# Patient Record
Sex: Male | Born: 1950
Health system: Southern US, Community
[De-identification: ages and names within clinical notes are randomized; demographics above are authoritative.]

## PROBLEM LIST (undated history)

## (undated) DIAGNOSIS — G4730 Sleep apnea, unspecified: Secondary | ICD-10-CM

## (undated) DIAGNOSIS — E785 Hyperlipidemia, unspecified: Secondary | ICD-10-CM

## (undated) DIAGNOSIS — E7800 Pure hypercholesterolemia, unspecified: Secondary | ICD-10-CM

## (undated) DIAGNOSIS — I10 Essential (primary) hypertension: Secondary | ICD-10-CM

## (undated) DIAGNOSIS — K219 Gastro-esophageal reflux disease without esophagitis: Secondary | ICD-10-CM

## (undated) DIAGNOSIS — I219 Acute myocardial infarction, unspecified: Secondary | ICD-10-CM

## (undated) DIAGNOSIS — I2510 Atherosclerotic heart disease of native coronary artery without angina pectoris: Secondary | ICD-10-CM

## (undated) DIAGNOSIS — E119 Type 2 diabetes mellitus without complications: Secondary | ICD-10-CM

## (undated) DIAGNOSIS — R42 Dizziness and giddiness: Secondary | ICD-10-CM

## (undated) DIAGNOSIS — L570 Actinic keratosis: Secondary | ICD-10-CM

## (undated) HISTORY — DX: Type 2 diabetes mellitus without complications: E11.9

## (undated) HISTORY — DX: Sleep apnea, unspecified: G47.30

## (undated) HISTORY — PX: CARDIAC CATHETERIZATION: SHX172

## (undated) HISTORY — DX: Gastro-esophageal reflux disease without esophagitis: K21.9

## (undated) HISTORY — PX: TONSILLECTOMY: SUR1361

## (undated) HISTORY — DX: Acute myocardial infarction, unspecified: I21.9

## (undated) HISTORY — DX: Essential (primary) hypertension: I10

## (undated) HISTORY — DX: Hyperlipidemia, unspecified: E78.5

## (undated) HISTORY — PX: CORONARY ANGIOPLASTY WITH STENT PLACEMENT: SHX49

## (undated) HISTORY — DX: Atherosclerotic heart disease of native coronary artery without angina pectoris: I25.10

---

## 2005-04-25 ENCOUNTER — Ambulatory Visit: Payer: Self-pay | Admitting: Family Medicine

## 2005-05-23 ENCOUNTER — Ambulatory Visit: Payer: Self-pay | Admitting: Family Medicine

## 2007-12-02 DIAGNOSIS — C4492 Squamous cell carcinoma of skin, unspecified: Secondary | ICD-10-CM

## 2007-12-02 HISTORY — DX: Squamous cell carcinoma of skin, unspecified: C44.92

## 2008-07-20 ENCOUNTER — Ambulatory Visit: Payer: Self-pay | Admitting: Gastroenterology

## 2010-11-30 ENCOUNTER — Emergency Department: Payer: Self-pay | Admitting: Emergency Medicine

## 2010-12-27 ENCOUNTER — Ambulatory Visit: Payer: Self-pay | Admitting: Urology

## 2010-12-27 IMAGING — CR DG ABDOMEN 1V
1 series · 2 of 2 positions shown · non-contrast
Comparison: none

REASON FOR EXAM: Nephrolithiasis
COMMENTS:

[Series 1: view not recorded · 0.17mm/px · 2 of 2 slices shown]
[im 1/2]
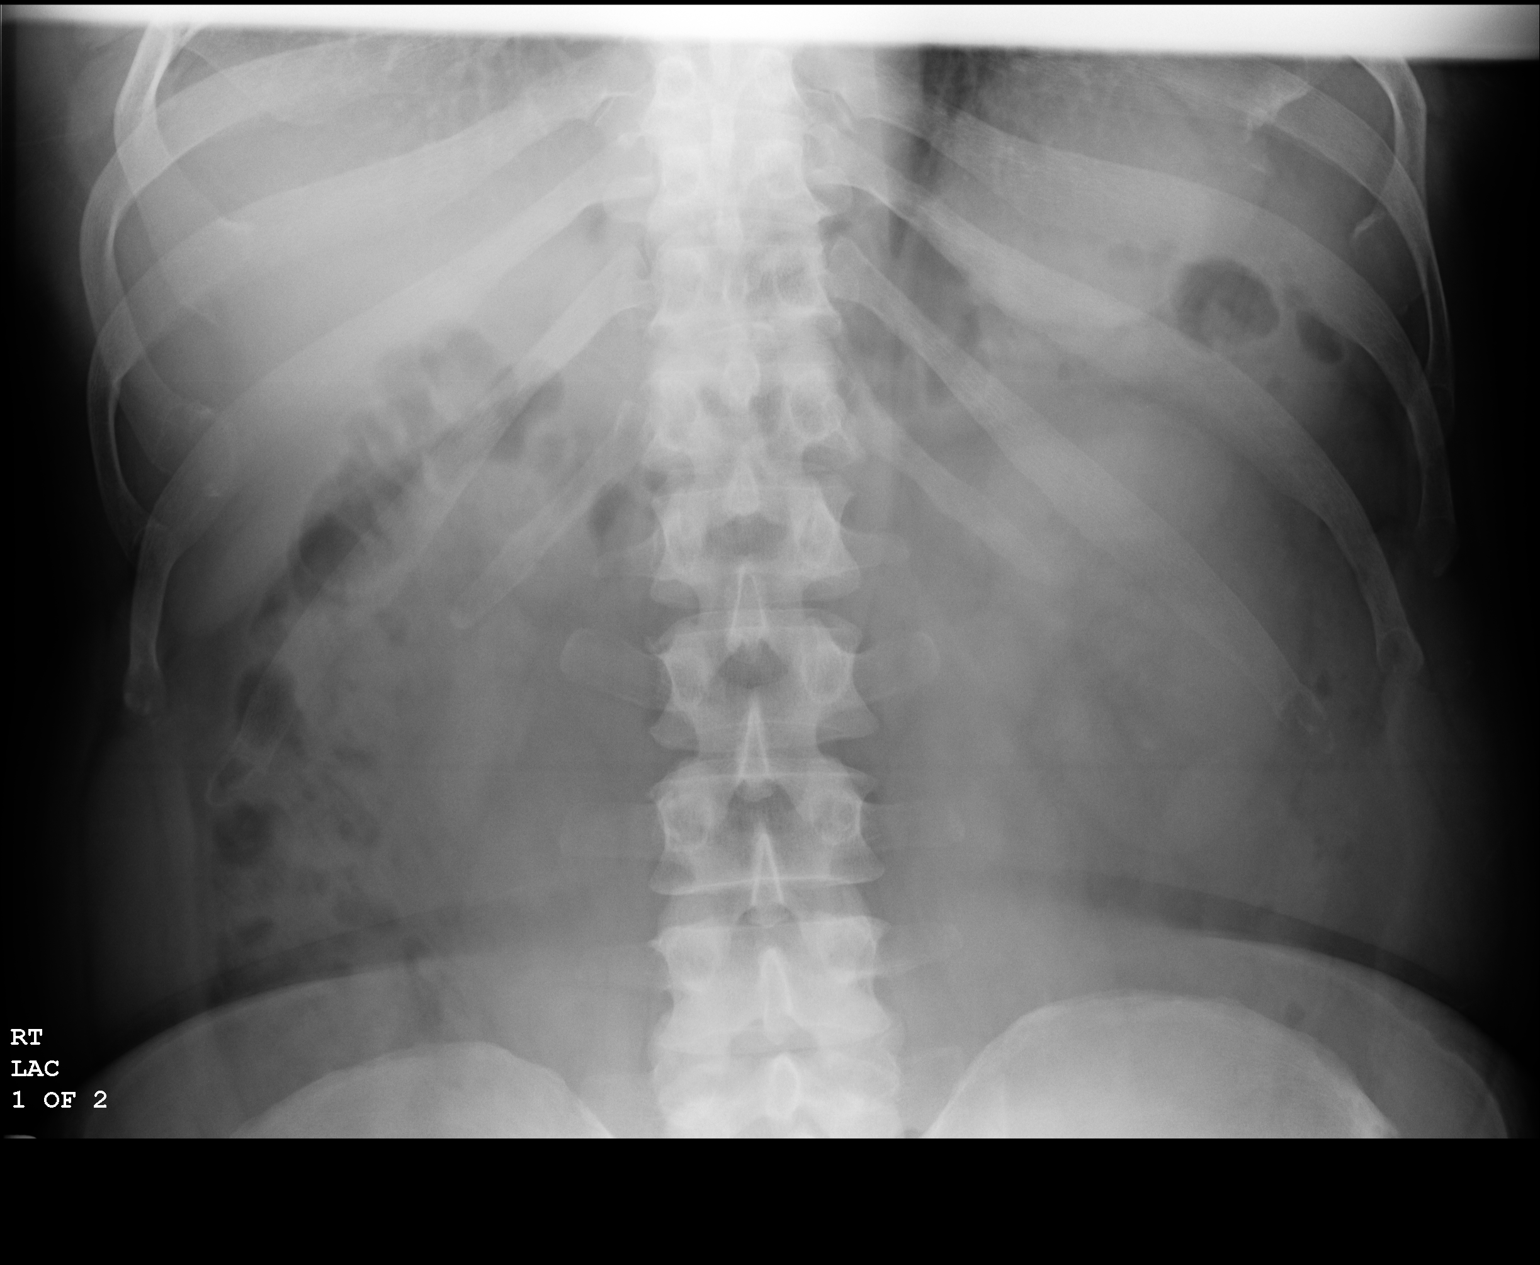
[im 2/2]
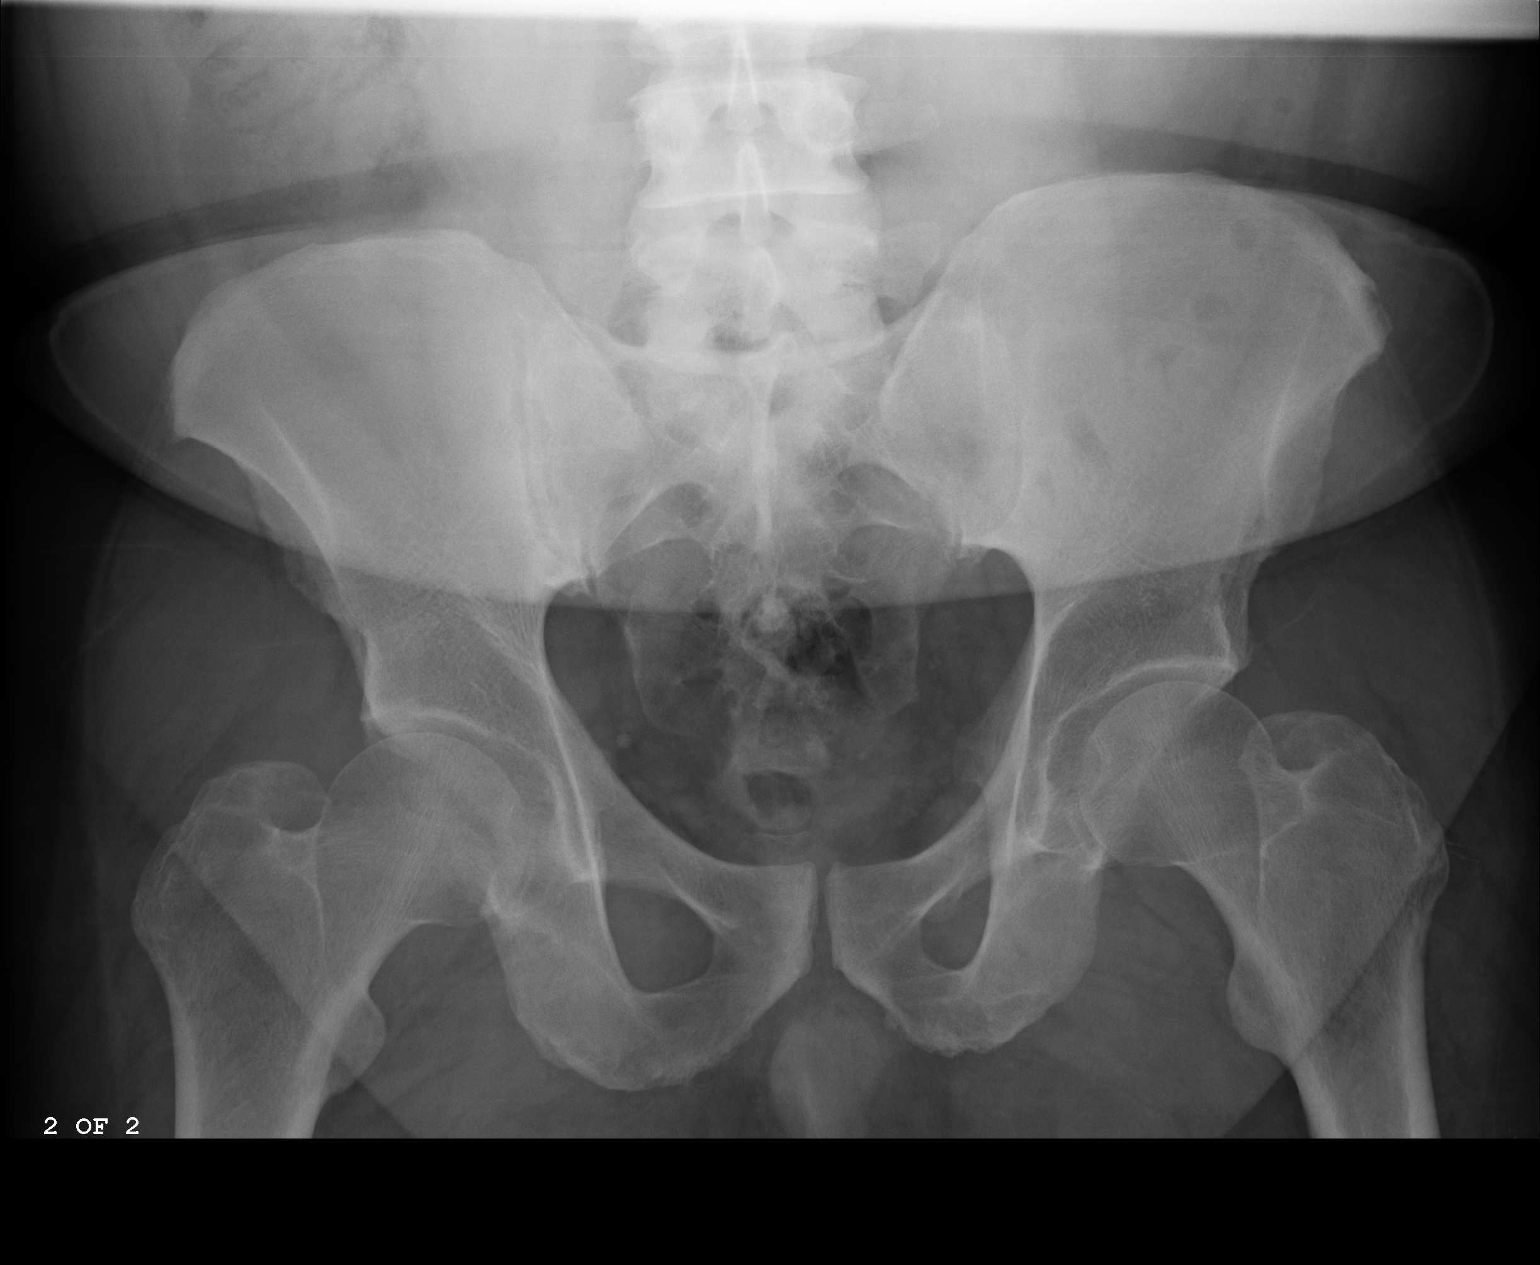

[2 of 2 positions shown; findings below may reference images not displayed]

PROCEDURE:     DXR - DXR KIDNEY URETER BLADDER  - [DATE]  [DATE]

RESULT:     An AP view of the abdomen demonstrates no definite renal or
ureteral calcifications are identified. There are two tiny calcifications on
the left projected lateral to the sacrum. These conceivably could represent
the tiny distal left ureteral stones noted at prior CT but these also may
represent tiny phleboliths. There is also noted a 2 mm calcification
projected over the region of the left ureterovesical junction. This, too, is
nonspecific and could represent a tiny stone or phlebolith. A 5 mm
phlebolith is noted in the right pelvis.
IMPRESSION: 1. No definite renal or ureteral calcifications are seen.
2. There are a few tiny calcifications in the left pelvis but it is
uncertain as to whether these represent the obstructing calcifications
previously noted at CT or if the densities are external to the urinary tract.
3. A phlebolith is noted in the right pelvis.
4. The bowel gas pattern is normal.

## 2012-07-01 ENCOUNTER — Ambulatory Visit: Payer: Self-pay | Admitting: Otolaryngology

## 2012-07-01 LAB — CREATININE, SERUM: EGFR (Non-African Amer.): >60

## 2013-08-17 ENCOUNTER — Ambulatory Visit: Payer: Self-pay | Admitting: Physician Assistant

## 2014-04-11 ENCOUNTER — Ambulatory Visit: Payer: Self-pay | Admitting: Adult Health

## 2014-04-13 ENCOUNTER — Ambulatory Visit (INDEPENDENT_AMBULATORY_CARE_PROVIDER_SITE_OTHER): Payer: Commercial Managed Care - PPO

## 2014-04-13 ENCOUNTER — Encounter: Payer: Self-pay | Admitting: Podiatry

## 2014-04-13 ENCOUNTER — Ambulatory Visit (INDEPENDENT_AMBULATORY_CARE_PROVIDER_SITE_OTHER): Payer: Commercial Managed Care - PPO | Admitting: Podiatry

## 2014-04-13 VITALS — BP 150/94 | HR 64 | Resp 18

## 2014-04-13 DIAGNOSIS — R52 Pain, unspecified: Secondary | ICD-10-CM

## 2014-04-13 DIAGNOSIS — M779 Enthesopathy, unspecified: Secondary | ICD-10-CM

## 2014-04-13 MED ORDER — DICLOFENAC SODIUM 1 % TD GEL: 2.0000 g | Freq: Four times a day (QID) | TRANSDERMAL | Status: DC

## 2014-04-13 MED ORDER — MELOXICAM 7.5 MG PO TABS: 7.5000 mg | ORAL_TABLET | Freq: Every day | ORAL | Status: DC

## 2014-04-13 NOTE — Progress Notes (Signed)
   Subjective:    Patient ID: Andre Schroeder, male    DOB: 04/11/1950, 64 y.o.   MRN: [id number]  HPI  64 year old male presents the office today with complaints of pain to his right foot and ankle which is been ongoing for approximately 2 weeks. He states the majority of the pain is on the outside of this foot and ankle.  He states it is a dull ache and intermittent leg has some sharp pain and swelling to the area. He states that today it is not as painful and it appears to be resolving. He denies any recent injury or trauma to the area. He states that he has pain mostly when wearing flat shoes however he does not seem to have symptoms when wearing a supportive shoe.He does state that he has a history of a left leg blood clot approximate one year ago and he started to feel as if it was the same symptoms so he had a venous duplex performed yesterday which was negative for DVT.   Review of Systems  Neurological: Positive for dizziness.  All other systems reviewed and are negative.      Objective:   Physical Exam AAO x3, NAD DP/PT pulses palpable b/l, CRT < 3 sec Sensation intact with Simms Weinstein monofilament, vibratory sensation intact, Achilles tendon reflex intact. There is mild tenderness on the lateral facet of foot and ankle which appears to be overlying the course of the peroneal tendons and along the insertion of the fifth metatarsal. There is also some discomfort on the plantar aspects of the lateral foot. There is no pinpoint bony tenderness or pain with vibratory sensation overlying the fifth metatarsal or in other areas of the foot. There is mild overlying edema without any overlying erythema or increase in warmth. Upon gait the patient does tend to have more of a supinated type gait and putting excess pressure on the lateral aspect of the foot. MMT 5/5, ROM WNL No open lesions or pre-ulcerative lesions are identified. No pain with calf compression, swelling, warmth, erythema.       Assessment & Plan:  64 year old male with lateral foot and ankle pain, likely resolving peroneal tendinitis -X-rays were obtained and reviewed with the patient. -Treatment options were discussed with the patient including alternatives, risks, complications. Discussed likely etiology of the patient's symptoms. -Prescribed meloxicam. Discussed side effects the medication digit itself immediately should any occur and call the office. -Prescribed Voltaren gel. -Ice to the area. -Discussed supportive shoe gear and possible orthotics. -Discussed stretching and streaking exercises for the peroneal tendons. -Follow-up in 4 weeks or sooner should any problems arise. In the meantime encouraged to call the office with any questions, concerns, change in symptoms.

## 2014-04-17 ENCOUNTER — Encounter: Payer: Self-pay | Admitting: Podiatry

## 2014-05-01 ENCOUNTER — Telehealth: Payer: Self-pay | Admitting: *Deleted

## 2014-05-01 NOTE — Telephone Encounter (Signed)
I faxed a prescription for a compound cream to [facility] per Dr. Jacqualyn Posey to treat Tendinosis. Baclofen 2%, Diclofenac 3% and Verapamil 10%, Quantity: 180gm, Refills: 2, Diagnosis: Peroneal Tendonitis No Known Drug Allergies  I called and left him a message to expect compound cream to arrive in the mail.  We were not able to get other prescribed medication approved by your insurance.

## 2018-04-07 DIAGNOSIS — H52223 Regular astigmatism, bilateral: Secondary | ICD-10-CM | POA: Diagnosis not present

## 2018-05-27 ENCOUNTER — Ambulatory Visit: Payer: Medicare HMO | Attending: Neurology

## 2018-05-27 DIAGNOSIS — G4733 Obstructive sleep apnea (adult) (pediatric): Secondary | ICD-10-CM | POA: Insufficient documentation

## 2018-05-27 DIAGNOSIS — E669 Obesity, unspecified: Secondary | ICD-10-CM | POA: Diagnosis not present

## 2018-05-27 DIAGNOSIS — R0683 Snoring: Secondary | ICD-10-CM | POA: Diagnosis not present

## 2018-05-27 DIAGNOSIS — R400 Somnolence: Secondary | ICD-10-CM | POA: Diagnosis not present

## 2018-06-14 DIAGNOSIS — K219 Gastro-esophageal reflux disease without esophagitis: Secondary | ICD-10-CM | POA: Diagnosis not present

## 2018-06-14 DIAGNOSIS — G4733 Obstructive sleep apnea (adult) (pediatric): Secondary | ICD-10-CM | POA: Diagnosis not present

## 2018-06-14 DIAGNOSIS — E7800 Pure hypercholesterolemia, unspecified: Secondary | ICD-10-CM | POA: Diagnosis not present

## 2018-06-14 DIAGNOSIS — I2510 Atherosclerotic heart disease of native coronary artery without angina pectoris: Secondary | ICD-10-CM | POA: Diagnosis not present

## 2018-06-14 DIAGNOSIS — R7309 Other abnormal glucose: Secondary | ICD-10-CM | POA: Diagnosis not present

## 2018-06-14 DIAGNOSIS — I10 Essential (primary) hypertension: Secondary | ICD-10-CM | POA: Diagnosis not present

## 2018-06-23 DIAGNOSIS — I2510 Atherosclerotic heart disease of native coronary artery without angina pectoris: Secondary | ICD-10-CM | POA: Diagnosis not present

## 2018-06-23 DIAGNOSIS — E7800 Pure hypercholesterolemia, unspecified: Secondary | ICD-10-CM | POA: Diagnosis not present

## 2018-06-23 DIAGNOSIS — G4733 Obstructive sleep apnea (adult) (pediatric): Secondary | ICD-10-CM | POA: Diagnosis not present

## 2018-06-23 DIAGNOSIS — I10 Essential (primary) hypertension: Secondary | ICD-10-CM | POA: Diagnosis not present

## 2018-06-30 DIAGNOSIS — K219 Gastro-esophageal reflux disease without esophagitis: Secondary | ICD-10-CM | POA: Diagnosis not present

## 2018-06-30 DIAGNOSIS — Z1211 Encounter for screening for malignant neoplasm of colon: Secondary | ICD-10-CM | POA: Diagnosis not present

## 2018-06-30 DIAGNOSIS — I2510 Atherosclerotic heart disease of native coronary artery without angina pectoris: Secondary | ICD-10-CM | POA: Diagnosis not present

## 2018-07-02 DIAGNOSIS — R7309 Other abnormal glucose: Secondary | ICD-10-CM | POA: Diagnosis not present

## 2018-07-02 DIAGNOSIS — G4733 Obstructive sleep apnea (adult) (pediatric): Secondary | ICD-10-CM | POA: Diagnosis not present

## 2018-07-02 DIAGNOSIS — I10 Essential (primary) hypertension: Secondary | ICD-10-CM | POA: Diagnosis not present

## 2018-07-08 DIAGNOSIS — G4733 Obstructive sleep apnea (adult) (pediatric): Secondary | ICD-10-CM | POA: Diagnosis not present

## 2018-07-09 DIAGNOSIS — G4733 Obstructive sleep apnea (adult) (pediatric): Secondary | ICD-10-CM | POA: Diagnosis not present

## 2018-08-07 DIAGNOSIS — G4733 Obstructive sleep apnea (adult) (pediatric): Secondary | ICD-10-CM | POA: Diagnosis not present

## 2018-09-07 DIAGNOSIS — G4733 Obstructive sleep apnea (adult) (pediatric): Secondary | ICD-10-CM | POA: Diagnosis not present

## 2018-10-07 DIAGNOSIS — G4733 Obstructive sleep apnea (adult) (pediatric): Secondary | ICD-10-CM | POA: Diagnosis not present

## 2018-10-08 DIAGNOSIS — Z0000 Encounter for general adult medical examination without abnormal findings: Secondary | ICD-10-CM | POA: Diagnosis not present

## 2018-10-08 DIAGNOSIS — E119 Type 2 diabetes mellitus without complications: Secondary | ICD-10-CM | POA: Diagnosis not present

## 2018-10-08 DIAGNOSIS — E7800 Pure hypercholesterolemia, unspecified: Secondary | ICD-10-CM | POA: Diagnosis not present

## 2018-10-08 DIAGNOSIS — I10 Essential (primary) hypertension: Secondary | ICD-10-CM | POA: Diagnosis not present

## 2018-10-08 DIAGNOSIS — I2510 Atherosclerotic heart disease of native coronary artery without angina pectoris: Secondary | ICD-10-CM | POA: Diagnosis not present

## 2018-10-22 ENCOUNTER — Encounter: Payer: Medicare HMO | Attending: Family Medicine | Admitting: *Deleted

## 2018-10-22 ENCOUNTER — Encounter: Payer: Self-pay | Admitting: *Deleted

## 2018-10-22 ENCOUNTER — Other Ambulatory Visit: Payer: Self-pay

## 2018-10-22 VITALS — BP 116/76 | Ht 69.0 in | Wt 258.2 lb

## 2018-10-22 DIAGNOSIS — E119 Type 2 diabetes mellitus without complications: Secondary | ICD-10-CM | POA: Diagnosis not present

## 2018-10-22 NOTE — Progress Notes (Signed)
Diabetes Self-Management Education  Visit Type: First/Initial  Appt. Start Time: 1330 Appt. End Time: 0017  10/22/2018  [name], identified by name and date of birth, is a 68 y.o. male with a diagnosis of Diabetes: Type 2.   ASSESSMENT  Blood pressure 116/76, height 5\' 9"  (1.753 m), weight 258 lb 3.2 oz (117.1 kg). Body mass index is 38.13 kg/m.  Diabetes Self-Management Education - 10/22/18 1514      Visit Information   Visit Type  First/Initial      Initial Visit   Diabetes Type  Type 2    Are you currently following a meal plan?  Yes    What type of meal plan do you follow?  "cutting out bread and sweet tea"    Are you taking your medications as prescribed?  Yes    Date Diagnosed  1 month      Health Coping   How would you rate your overall health?  Good      Psychosocial Assessment   Patient Belief/Attitude about Diabetes  Other (comment)   "concerned"   Self-care barriers  None    Self-management support  Doctor's office;Family    Patient Concerns  Nutrition/Meal planning;Medication;Monitoring;Glycemic Control;Weight Control    Special Needs  None    Preferred Learning Style  Auditory    Learning Readiness  Change in progress    How often do you need to have someone help you when you read instructions, pamphlets, or other written materials from your doctor or pharmacy?  1 - Never    What is the last grade level you completed in school?  BA      Pre-Education Assessment   Patient understands the diabetes disease and treatment process.  Needs Instruction    Patient understands incorporating nutritional management into lifestyle.  Needs Instruction    Patient undertands incorporating physical activity into lifestyle.  Needs Instruction    Patient understands using medications safely.  Needs Instruction    Patient understands monitoring blood glucose, interpreting and using results  Needs Instruction    Patient understands prevention, detection, and treatment  of acute complications.  Needs Instruction    Patient understands prevention, detection, and treatment of chronic complications.  Needs Instruction    Patient understands how to develop strategies to address psychosocial issues.  Needs Instruction    Patient understands how to develop strategies to promote health/change behavior.  Needs Instruction      Complications   Last HgB A1C per patient/outside source  7 %   10/08/2018   How often do you check your blood sugar?  0 times/day (not testing)   Provided Accu-Chek Guide Me meter and instructed on use. BG upon return demonstration was 101 mg/dL at 2:30 pm - 5 12/ hrs pp.   Have you had a dilated eye exam in the past 12 months?  Yes    Have you had a dental exam in the past 12 months?  Yes    Are you checking your feet?  No      Dietary Intake   Breakfast  chicken, ham and cheese; eggs; oatmeal; cereal and 2% milk    Snack (morning)  1-2 snacks/day - cereal, apple chips, cheese    Lunch  ceasar salad with chicken and crotons    Dinner  chicken, pork, occasional beef with potatoes, corn, peas, beans, rice, green beans, pasta, tomatoes, carrots, broccoli, cauliflower    Beverage(s)  water, Ice, coffee with artificial creamer, occasional sweet tea  Exercise   Exercise Type  Moderate (swimming / aerobic walking)   biking   How many days per week to you exercise?  5    How many minutes per day do you exercise?  60    Total minutes per week of exercise  300      Patient Education   Previous Diabetes Education  No    Disease state   Definition of diabetes, type 1 and 2, and the diagnosis of diabetes;Factors that contribute to the development of diabetes    Nutrition management   Role of diet in the treatment of diabetes and the relationship between the three main macronutrients and blood glucose level;Reviewed blood glucose goals for pre and post meals and how to evaluate the patients' food intake on their blood glucose level.    Physical  activity and exercise   Role of exercise on diabetes management, blood pressure control and cardiac health.    Medications  Reviewed patients medication for diabetes, action, purpose, timing of dose and side effects.    Monitoring  Taught/evaluated SMBG meter.;Purpose and frequency of SMBG.;Taught/discussed recording of test results and interpretation of SMBG.;Identified appropriate SMBG and/or A1C goals.    Chronic complications  Relationship between chronic complications and blood glucose control    Psychosocial adjustment  Identified and addressed patients feelings and concerns about diabetes      Individualized Goals (developed by patient)   Reducing Risk  Improve blood sugars Decrease medications Prevent diabetes complications Lose weight     Outcomes   Expected Outcomes  Demonstrated interest in learning. Expect positive outcomes    Future DMSE  2 months       Individualized Plan for Diabetes Self-Management Training:   Learning Objective:  Patient will have a greater understanding of diabetes self-management. Patient education plan is to attend individual and/or group sessions per assessed needs and concerns.   Plan:   Patient Instructions  Check blood sugars 2 x day before breakfast and 2 hrs after one meal  - 3 x week Bring blood sugar records to the next class Call your doctor for a prescription for:  1. Meter strips (type) Accu-Chek Guide checking  3 times per week  2. Lancets (type) Accu-Chek FastClix checking  3     times per week Exercise: Continue bike riding for   60  minutes 5 days a week Eat 3 meals day,  1-2  snacks a day Space meals 4-6 hours apart Continue to avoid sugar sweetened drinks (tea)  Expected Outcomes:  Demonstrated interest in learning. Expect positive outcomes  Education material provided:  General Meal Planning Guidelines Simple Meal Plan Meter = Accu-Chek Guide Me  If problems or questions, patient to contact team via: Andre Schroeder,  [place], [place], CDE [phone]  Future DSME appointment: 2 months  December 27, 2018 for Diabetes Class 1

## 2018-10-22 NOTE — Patient Instructions (Signed)
Check blood sugars 2 x day before breakfast and 2 hrs after one meal  - 3 x week Bring blood sugar records to the next class  Call your doctor for a prescription for:  1. Meter strips (type) Accu-Chek Guide checking  3 times per week  2. Lancets (type) Accu-Chek FastClix checking  3     times per week  Exercise: Continue bike riding for   60  minutes 5 days a week  Eat 3 meals day,  1-2  snacks a day Space meals 4-6 hours apart Continue to avoid sugar sweetened drinks (tea)  Return for classes on:

## 2018-11-01 ENCOUNTER — Other Ambulatory Visit
Admission: RE | Admit: 2018-11-01 | Discharge: 2018-11-01 | Disposition: A | Discharge status: Home / Self Care | Payer: Medicare HMO | Source: Ambulatory Visit | Attending: Gastroenterology | Admitting: Gastroenterology

## 2018-11-01 ENCOUNTER — Other Ambulatory Visit: Payer: Self-pay

## 2018-11-01 DIAGNOSIS — Z20828 Contact with and (suspected) exposure to other viral communicable diseases: Secondary | ICD-10-CM | POA: Diagnosis not present

## 2018-11-01 DIAGNOSIS — Z01812 Encounter for preprocedural laboratory examination: Secondary | ICD-10-CM | POA: Insufficient documentation

## 2018-11-01 LAB — SARS CORONAVIRUS 2 (TAT 6-24 HRS): SARS Coronavirus 2: NEGATIVE

## 2018-11-03 ENCOUNTER — Encounter: Payer: Self-pay | Admitting: *Deleted

## 2018-11-04 ENCOUNTER — Ambulatory Visit: Payer: Medicare HMO | Admitting: Anesthesiology

## 2018-11-04 ENCOUNTER — Ambulatory Visit
Admission: RE | Admit: 2018-11-04 | Discharge: 2018-11-04 | Disposition: A | Discharge status: Home / Self Care | Payer: Medicare HMO | Attending: Gastroenterology | Admitting: Gastroenterology

## 2018-11-04 ENCOUNTER — Encounter: Payer: Self-pay | Admitting: Anesthesiology

## 2018-11-04 ENCOUNTER — Encounter
Admission: RE | Disposition: A | Discharge status: Home / Self Care | Payer: Self-pay | Source: Home / Self Care | Attending: Gastroenterology

## 2018-11-04 ENCOUNTER — Other Ambulatory Visit: Payer: Self-pay

## 2018-11-04 DIAGNOSIS — L570 Actinic keratosis: Secondary | ICD-10-CM | POA: Insufficient documentation

## 2018-11-04 DIAGNOSIS — I10 Essential (primary) hypertension: Secondary | ICD-10-CM | POA: Insufficient documentation

## 2018-11-04 DIAGNOSIS — Z6837 Body mass index (BMI) 37.0-37.9, adult: Secondary | ICD-10-CM | POA: Diagnosis not present

## 2018-11-04 DIAGNOSIS — E669 Obesity, unspecified: Secondary | ICD-10-CM | POA: Insufficient documentation

## 2018-11-04 DIAGNOSIS — Z955 Presence of coronary angioplasty implant and graft: Secondary | ICD-10-CM | POA: Diagnosis not present

## 2018-11-04 DIAGNOSIS — K635 Polyp of colon: Secondary | ICD-10-CM | POA: Diagnosis not present

## 2018-11-04 DIAGNOSIS — E785 Hyperlipidemia, unspecified: Secondary | ICD-10-CM | POA: Insufficient documentation

## 2018-11-04 DIAGNOSIS — E119 Type 2 diabetes mellitus without complications: Secondary | ICD-10-CM | POA: Diagnosis not present

## 2018-11-04 DIAGNOSIS — Z1211 Encounter for screening for malignant neoplasm of colon: Secondary | ICD-10-CM | POA: Diagnosis not present

## 2018-11-04 DIAGNOSIS — Z7982 Long term (current) use of aspirin: Secondary | ICD-10-CM | POA: Diagnosis not present

## 2018-11-04 DIAGNOSIS — K219 Gastro-esophageal reflux disease without esophagitis: Secondary | ICD-10-CM | POA: Insufficient documentation

## 2018-11-04 DIAGNOSIS — Z79899 Other long term (current) drug therapy: Secondary | ICD-10-CM | POA: Diagnosis not present

## 2018-11-04 DIAGNOSIS — I252 Old myocardial infarction: Secondary | ICD-10-CM | POA: Diagnosis not present

## 2018-11-04 DIAGNOSIS — D122 Benign neoplasm of ascending colon: Secondary | ICD-10-CM | POA: Insufficient documentation

## 2018-11-04 DIAGNOSIS — G4730 Sleep apnea, unspecified: Secondary | ICD-10-CM | POA: Diagnosis not present

## 2018-11-04 DIAGNOSIS — K5790 Diverticulosis of intestine, part unspecified, without perforation or abscess without bleeding: Secondary | ICD-10-CM | POA: Diagnosis not present

## 2018-11-04 DIAGNOSIS — E7800 Pure hypercholesterolemia, unspecified: Secondary | ICD-10-CM | POA: Insufficient documentation

## 2018-11-04 DIAGNOSIS — Z7984 Long term (current) use of oral hypoglycemic drugs: Secondary | ICD-10-CM | POA: Diagnosis not present

## 2018-11-04 DIAGNOSIS — I2510 Atherosclerotic heart disease of native coronary artery without angina pectoris: Secondary | ICD-10-CM | POA: Diagnosis not present

## 2018-11-04 DIAGNOSIS — K5730 Diverticulosis of large intestine without perforation or abscess without bleeding: Secondary | ICD-10-CM | POA: Diagnosis not present

## 2018-11-04 HISTORY — DX: Actinic keratosis: L57.0

## 2018-11-04 HISTORY — DX: Pure hypercholesterolemia, unspecified: E78.00

## 2018-11-04 HISTORY — DX: Dizziness and giddiness: R42

## 2018-11-04 HISTORY — PX: COLONOSCOPY WITH PROPOFOL: SHX5780

## 2018-11-04 LAB — GLUCOSE, CAPILLARY: Glucose-Capillary: 88 mg/dL (ref 70–99)

## 2018-11-04 SURGERY — COLONOSCOPY WITH PROPOFOL
Anesthesia: General

## 2018-11-04 MED ORDER — PROPOFOL 500 MG/50ML IV EMUL
INTRAVENOUS | Status: DC | PRN
  Administered 2018-11-04: 70 ug/kg/min via INTRAVENOUS

## 2018-11-04 MED ORDER — PROPOFOL 500 MG/50ML IV EMUL
INTRAVENOUS | Status: AC
  Filled 2018-11-04: qty 50

## 2018-11-04 MED ORDER — PROPOFOL 10 MG/ML IV BOLUS
INTRAVENOUS | Status: DC | PRN
  Administered 2018-11-04 (×2): 40 mg via INTRAVENOUS

## 2018-11-04 MED ORDER — LIDOCAINE HCL (CARDIAC) PF 100 MG/5ML IV SOSY
PREFILLED_SYRINGE | INTRAVENOUS | Status: DC | PRN
  Administered 2018-11-04: 40 mg via INTRAVENOUS

## 2018-11-04 MED ORDER — SODIUM CHLORIDE 0.9 % IV SOLN
INTRAVENOUS | Status: DC
  Administered 2018-11-04: 11:00:00 via INTRAVENOUS

## 2018-11-04 MED ORDER — PROPOFOL 10 MG/ML IV BOLUS
INTRAVENOUS | Status: AC
  Filled 2018-11-04: qty 20

## 2018-11-04 MED ORDER — LIDOCAINE HCL (PF) 2 % IJ SOLN
INTRAMUSCULAR | Status: AC
  Filled 2018-11-04: qty 10

## 2018-11-04 NOTE — Anesthesia Preprocedure Evaluation (Signed)
Anesthesia Evaluation  Patient identified by MRN, date of birth, ID band Patient awake    Reviewed: Allergy & Precautions, NPO status , Patient's Chart, lab work & pertinent test results, reviewed documented beta blocker date and time   Airway Mallampati: III  TM Distance: >3 FB     Dental  (+) Chipped   Pulmonary sleep apnea ,           Cardiovascular hypertension, Pt. on medications and Pt. on home beta blockers + CAD, + Past MI and + Cardiac Stents       Neuro/Psych    GI/Hepatic GERD  ,  Endo/Other  diabetes, Type 2  Renal/GU      Musculoskeletal   Abdominal   Peds  Hematology   Anesthesia Other Findings Obese.  Reproductive/Obstetrics                             Anesthesia Physical Anesthesia Plan  ASA: III  Anesthesia Plan: General   Post-op Pain Management:    Induction: Intravenous  PONV Risk Score and Plan:   Airway Management Planned:   Additional Equipment:   Intra-op Plan:   Post-operative Plan:   Informed Consent: I have reviewed the patients History and Physical, chart, labs and discussed the procedure including the risks, benefits and alternatives for the proposed anesthesia with the patient or authorized representative who has indicated his/her understanding and acceptance.       Plan Discussed with: CRNA  Anesthesia Plan Comments:         Anesthesia Quick Evaluation

## 2018-11-04 NOTE — Transfer of Care (Signed)
Immediate Anesthesia Transfer of Care Note  Patient: Andre Schroeder  Procedure(s) Performed: COLONOSCOPY WITH PROPOFOL (N/A )  Patient Location: PACU  Anesthesia Type:General  Level of Consciousness: sedated  Airway & Oxygen Therapy: Patient Spontanous Breathing  Post-op Assessment: Report given to RN and Post -op Vital signs reviewed and stable  Post vital signs: Reviewed and stable  Last Vitals:  Vitals Value Taken Time  BP 99/68 11/04/18 1215  Temp 36.1 C 11/04/18 1215  Pulse 51 11/04/18 1215  Resp 17 11/04/18 1215  SpO2 96 % 11/04/18 1215    Last Pain:  Vitals:   11/04/18 1215  TempSrc:   PainSc: 0-No pain         Complications: No apparent anesthesia complications

## 2018-11-04 NOTE — Anesthesia Post-op Follow-up Note (Signed)
Anesthesia QCDR form completed.        

## 2018-11-04 NOTE — H&P (Signed)
Outpatient short stay form Pre-procedure 11/04/2018 11:39 AM Andre Sails MD  Primary Physician: Dr. Juluis Pitch  Reason for visit: Colonoscopy  History of present illness: Patient is a 68 year old male presenting today for colonoscopy in regards to colon screening.  Last colonoscopy was 07/20/2008- for polyps at that time.  Patient does take Brilinta and has been off of that agent for 5 to 6 days currently.  Takes no other blood thinning agent or aspirin product.  Tolerated his prep well.    Current Facility-Administered Medications:  .  0.9 %  sodium chloride infusion, , Intravenous, Continuous, Andre Sails, MD, Last Rate: 20 mL/hr at 11/04/18 1030  Medications Prior to Admission  Medication Sig Dispense Refill Last Dose  . esomeprazole (NEXIUM) 20 MG capsule Take 20 mg by mouth daily as needed.   11/02/2018  . nitroGLYCERIN (NITROSTAT) 0.4 MG SL tablet Place under the tongue.     Marland Kitchen aspirin EC 81 MG tablet Take 81 mg by mouth daily.   10/30/2018  . atorvastatin (LIPITOR) 20 MG tablet Take 20 mg by mouth daily.   11/02/2018  . carvedilol (COREG) 3.125 MG tablet Take 1 tablet by mouth 2 (two) times daily.   11/02/2018  . metFORMIN (GLUCOPHAGE-XR) 500 MG 24 hr tablet Take 500 mg by mouth 2 (two) times daily.   11/01/2018  . polyethylene glycol-electrolytes (NULYTELY/GOLYTELY) 420 g solution Take as directed for colonic prep.     . ticagrelor (BRILINTA) 90 MG TABS tablet Take 90 mg by mouth 2 (two) times daily.   10/30/2018  . valsartan (DIOVAN) 80 MG tablet Take 80 mg by mouth daily.   11/02/2018     No Known Allergies   Past Medical History:  Diagnosis Date  . Actinic keratosis   . CAD (coronary artery disease)   . Diabetes mellitus without complication ([facility])   . GERD (gastroesophageal reflux disease)   . Hyperlipidemia   . Hypertension   . Myocardial infarction ([facility])   . Pure hypercholesterolemia   . Sleep apnea   . Vertigo     Review of systems:      Physical  Exam    Heart and lungs: Regular rate and rhythm without rub or gallop lungs are bilaterally clear    HEENT: Normocephalic atraumatic eyes are anicteric    Other:    Pertinant exam for procedure: Soft nontender nondistended bowel sounds positive normoactive.    Planned proceedures: Colonoscopy and indicated procedures. I have discussed the risks benefits and complications of procedures to include not limited to bleeding, infection, perforation and the risk of sedation and the patient wishes to proceed.    Andre Sails, MD Gastroenterology 11/04/2018  11:39 AM

## 2018-11-04 NOTE — Anesthesia Postprocedure Evaluation (Signed)
Anesthesia Post Note  Patient: Andre Schroeder  Procedure(s) Performed: COLONOSCOPY WITH PROPOFOL (N/A )  Patient location during evaluation: Endoscopy Anesthesia Type: General Level of consciousness: awake and alert Pain management: pain level controlled Vital Signs Assessment: post-procedure vital signs reviewed and stable Respiratory status: spontaneous breathing, nonlabored ventilation, respiratory function stable and patient connected to nasal cannula oxygen Cardiovascular status: blood pressure returned to baseline and stable Postop Assessment: no apparent nausea or vomiting Anesthetic complications: no     Last Vitals:  Vitals:   11/04/18 1225 11/04/18 1235  BP: 112/70 120/76  Pulse: (!) 52 (!) 49  Resp: 16 12  Temp:    SpO2: 98% 100%    Last Pain:  Vitals:   11/04/18 1235  TempSrc:   PainSc: 0-No pain                 Lyman Balingit S

## 2018-11-04 NOTE — Op Note (Signed)
[facility] Patient Name: Andre Schroeder Procedure Date: 11/04/2018 11:32 AM MRN: [id number] Account #: [id number] Date of Birth: 12/08/1950 Admit Type: Outpatient Age: 68 Room: [facility] ENDO ROOM 3 Gender: Male Note Status: Finalized Procedure:            Colonoscopy Indications:          Screening for colorectal malignant neoplasm Providers:            Lollie Sails, MD Medicines:            Monitored Anesthesia Care Complications:        No immediate complications. Procedure:            Pre-Anesthesia Assessment:                       - ASA Grade Assessment: III - A patient with severe                        systemic disease.                       After obtaining informed consent, the colonoscope was                        passed under direct vision. Throughout the procedure,                        the patient's blood pressure, pulse, and oxygen                        saturations were monitored continuously. The                        Colonoscope was introduced through the anus and                        advanced to the the cecum, identified by appendiceal                        orifice and ileocecal valve. The colonoscopy was                        performed without difficulty. The patient tolerated the                        procedure well. The quality of the bowel preparation                        was good. Findings:      Two sessile polyps were found in the distal ascending colon. The polyps       were 3 to 4 mm in size. These polyps were removed with a cold biopsy       forceps. Resection and retrieval were complete.      Multiple small to medium-mouthed diverticula were found in the       descending colon and ascending colon.      The retroflexed view of the distal rectum and anal verge was normal and       showed no anal or rectal abnormalities.      The digital rectal exam was normal. Impression:           -  Two 3 to 4 mm polyps in  the distal ascending colon,                        removed with a cold biopsy forceps. Resected and                        retrieved.                       - Diverticulosis in the descending colon and in the                        ascending colon.                       - The distal rectum and anal verge are normal on                        retroflexion view. Recommendation:       - Discharge patient to home.                       - Soft diet today, then advance as tolerated to advance                        diet as tolerated.                       - Telephone GI clinic for pathology results in 5 days. Procedure Code(s):    --- Professional ---                       [phone], Colonoscopy, flexible; with biopsy, single or                        multiple Diagnosis Code(s):    --- Professional ---                       Z12.11, Encounter for screening for malignant neoplasm                        of colon                       K63.5, Polyp of colon                       K57.30, Diverticulosis of large intestine without                        perforation or abscess without bleeding CPT copyright 2019 [employer]. All rights reserved. The codes documented in this report are preliminary and upon coder review may  be revised to meet current compliance requirements. Lollie Sails, MD 11/04/2018 12:13:44 PM This report has been signed electronically. Number of Addenda: 0 Note Initiated On: 11/04/2018 11:32 AM Scope Withdrawal Time: 0 hours 13 minutes 22 seconds  Total Procedure Duration: 0 hours 21 minutes 59 seconds       [facility]

## 2018-11-05 ENCOUNTER — Encounter: Payer: Self-pay | Admitting: Gastroenterology

## 2018-11-05 LAB — SURGICAL PATHOLOGY

## 2018-11-07 DIAGNOSIS — G4733 Obstructive sleep apnea (adult) (pediatric): Secondary | ICD-10-CM | POA: Diagnosis not present

## 2018-12-08 DIAGNOSIS — G4733 Obstructive sleep apnea (adult) (pediatric): Secondary | ICD-10-CM | POA: Diagnosis not present

## 2018-12-23 DIAGNOSIS — G4733 Obstructive sleep apnea (adult) (pediatric): Secondary | ICD-10-CM | POA: Diagnosis not present

## 2018-12-23 DIAGNOSIS — E7800 Pure hypercholesterolemia, unspecified: Secondary | ICD-10-CM | POA: Diagnosis not present

## 2018-12-23 DIAGNOSIS — K219 Gastro-esophageal reflux disease without esophagitis: Secondary | ICD-10-CM | POA: Diagnosis not present

## 2018-12-23 DIAGNOSIS — E119 Type 2 diabetes mellitus without complications: Secondary | ICD-10-CM | POA: Diagnosis not present

## 2018-12-23 DIAGNOSIS — I2510 Atherosclerotic heart disease of native coronary artery without angina pectoris: Secondary | ICD-10-CM | POA: Diagnosis not present

## 2018-12-23 DIAGNOSIS — I10 Essential (primary) hypertension: Secondary | ICD-10-CM | POA: Diagnosis not present

## 2018-12-27 ENCOUNTER — Other Ambulatory Visit: Payer: Self-pay

## 2018-12-27 ENCOUNTER — Encounter: Payer: Medicare HMO | Attending: Family Medicine

## 2018-12-27 ENCOUNTER — Encounter: Payer: Self-pay | Admitting: Dietician

## 2018-12-27 DIAGNOSIS — E119 Type 2 diabetes mellitus without complications: Secondary | ICD-10-CM | POA: Insufficient documentation

## 2018-12-27 NOTE — Progress Notes (Signed)
Patient did not attend diabetes class 1 today as scheduled. Will contact patient to reschedule class series.

## 2018-12-29 ENCOUNTER — Telehealth: Payer: Self-pay | Admitting: Dietician

## 2018-12-29 NOTE — Telephone Encounter (Signed)
Called patient to reschedule diabetes class series, as he missed class 1 on 12/27/18. Patient reports forgetting about the class. He rescheduled for 01/17/19, 01/24/19, and 01/31/19.

## 2019-01-03 ENCOUNTER — Ambulatory Visit: Payer: Medicare HMO

## 2019-01-07 DIAGNOSIS — G4733 Obstructive sleep apnea (adult) (pediatric): Secondary | ICD-10-CM | POA: Diagnosis not present

## 2019-01-10 ENCOUNTER — Ambulatory Visit: Payer: Medicare HMO

## 2019-01-17 ENCOUNTER — Encounter: Payer: Medicare HMO | Attending: Family Medicine | Admitting: Dietician

## 2019-01-17 ENCOUNTER — Other Ambulatory Visit: Payer: Self-pay

## 2019-01-17 ENCOUNTER — Encounter: Payer: Self-pay | Admitting: Dietician

## 2019-01-17 VITALS — Ht 69.0 in | Wt 263.5 lb

## 2019-01-17 DIAGNOSIS — E119 Type 2 diabetes mellitus without complications: Secondary | ICD-10-CM | POA: Diagnosis not present

## 2019-01-17 NOTE — Progress Notes (Signed)

## 2019-01-19 ENCOUNTER — Other Ambulatory Visit: Payer: Self-pay

## 2019-01-24 ENCOUNTER — Encounter: Payer: Medicare HMO | Admitting: Dietician

## 2019-01-24 ENCOUNTER — Other Ambulatory Visit: Payer: Self-pay

## 2019-01-24 ENCOUNTER — Encounter: Payer: Self-pay | Admitting: Dietician

## 2019-01-24 VITALS — Wt 261.1 lb

## 2019-01-24 DIAGNOSIS — E119 Type 2 diabetes mellitus without complications: Secondary | ICD-10-CM

## 2019-01-24 NOTE — Progress Notes (Signed)

## 2019-01-31 ENCOUNTER — Encounter: Payer: Medicare HMO | Admitting: Dietician

## 2019-01-31 ENCOUNTER — Other Ambulatory Visit: Payer: Self-pay

## 2019-01-31 VITALS — BP 140/88 | Ht 69.0 in | Wt 262.7 lb

## 2019-01-31 DIAGNOSIS — E119 Type 2 diabetes mellitus without complications: Secondary | ICD-10-CM

## 2019-01-31 NOTE — Progress Notes (Signed)

## 2019-02-01 ENCOUNTER — Encounter: Payer: Self-pay | Admitting: *Deleted

## 2019-02-07 DIAGNOSIS — G4733 Obstructive sleep apnea (adult) (pediatric): Secondary | ICD-10-CM | POA: Diagnosis not present

## 2019-02-09 DIAGNOSIS — I10 Essential (primary) hypertension: Secondary | ICD-10-CM | POA: Diagnosis not present

## 2019-02-09 DIAGNOSIS — R42 Dizziness and giddiness: Secondary | ICD-10-CM | POA: Diagnosis not present

## 2019-02-09 DIAGNOSIS — H8111 Benign paroxysmal vertigo, right ear: Secondary | ICD-10-CM | POA: Diagnosis not present

## 2019-02-09 DIAGNOSIS — E119 Type 2 diabetes mellitus without complications: Secondary | ICD-10-CM | POA: Diagnosis not present

## 2019-02-22 ENCOUNTER — Telehealth: Payer: Self-pay | Admitting: *Deleted

## 2019-02-22 NOTE — Telephone Encounter (Signed)
Received voice mail from patient that he had a question about his diabetes. Called him and he reports that his readings were 113-150 but he has been getting lower numbers of 79 and 85. He questioned if this was too low. Explained that these were normal readings. He denies any symptoms of low blood sugars. He is only taking Metformin daily. Discussed readings of less than 70 were considered hypoglycemia. Also discussed appropriate ranges for fasting of 80-130's mg/dL and 2 hrs pp of less than 180's mg/dL when diagnosed with diabetes. He just completed diabetes classes last month. Instructed him to call back for any further questions.

## 2019-03-09 DIAGNOSIS — G4733 Obstructive sleep apnea (adult) (pediatric): Secondary | ICD-10-CM | POA: Diagnosis not present

## 2019-04-09 DIAGNOSIS — G4733 Obstructive sleep apnea (adult) (pediatric): Secondary | ICD-10-CM | POA: Diagnosis not present

## 2019-04-12 DIAGNOSIS — H8111 Benign paroxysmal vertigo, right ear: Secondary | ICD-10-CM | POA: Diagnosis not present

## 2019-04-13 DIAGNOSIS — Z125 Encounter for screening for malignant neoplasm of prostate: Secondary | ICD-10-CM | POA: Diagnosis not present

## 2019-04-13 DIAGNOSIS — I252 Old myocardial infarction: Secondary | ICD-10-CM | POA: Diagnosis not present

## 2019-04-13 DIAGNOSIS — M25512 Pain in left shoulder: Secondary | ICD-10-CM | POA: Diagnosis not present

## 2019-04-13 DIAGNOSIS — E119 Type 2 diabetes mellitus without complications: Secondary | ICD-10-CM | POA: Diagnosis not present

## 2019-04-13 DIAGNOSIS — E7800 Pure hypercholesterolemia, unspecified: Secondary | ICD-10-CM | POA: Diagnosis not present

## 2019-04-13 DIAGNOSIS — Z955 Presence of coronary angioplasty implant and graft: Secondary | ICD-10-CM | POA: Diagnosis not present

## 2019-04-13 DIAGNOSIS — Z9989 Dependence on other enabling machines and devices: Secondary | ICD-10-CM | POA: Diagnosis not present

## 2019-04-13 DIAGNOSIS — Z7984 Long term (current) use of oral hypoglycemic drugs: Secondary | ICD-10-CM | POA: Diagnosis not present

## 2019-04-13 DIAGNOSIS — G4733 Obstructive sleep apnea (adult) (pediatric): Secondary | ICD-10-CM | POA: Diagnosis not present

## 2019-04-13 DIAGNOSIS — I10 Essential (primary) hypertension: Secondary | ICD-10-CM | POA: Diagnosis not present

## 2019-04-13 DIAGNOSIS — I2510 Atherosclerotic heart disease of native coronary artery without angina pectoris: Secondary | ICD-10-CM | POA: Diagnosis not present

## 2019-04-19 DIAGNOSIS — H8111 Benign paroxysmal vertigo, right ear: Secondary | ICD-10-CM | POA: Diagnosis not present

## 2019-04-22 DIAGNOSIS — E1136 Type 2 diabetes mellitus with diabetic cataract: Secondary | ICD-10-CM | POA: Diagnosis not present

## 2019-04-22 DIAGNOSIS — E119 Type 2 diabetes mellitus without complications: Secondary | ICD-10-CM | POA: Diagnosis not present

## 2019-04-26 DIAGNOSIS — H8111 Benign paroxysmal vertigo, right ear: Secondary | ICD-10-CM | POA: Diagnosis not present

## 2019-05-08 DIAGNOSIS — G4733 Obstructive sleep apnea (adult) (pediatric): Secondary | ICD-10-CM | POA: Diagnosis not present

## 2019-05-23 DIAGNOSIS — R972 Elevated prostate specific antigen [PSA]: Secondary | ICD-10-CM | POA: Diagnosis not present

## 2019-05-23 DIAGNOSIS — N410 Acute prostatitis: Secondary | ICD-10-CM | POA: Diagnosis not present

## 2019-06-07 DIAGNOSIS — G4733 Obstructive sleep apnea (adult) (pediatric): Secondary | ICD-10-CM | POA: Diagnosis not present

## 2019-06-13 DIAGNOSIS — E119 Type 2 diabetes mellitus without complications: Secondary | ICD-10-CM | POA: Diagnosis not present

## 2019-06-13 DIAGNOSIS — I10 Essential (primary) hypertension: Secondary | ICD-10-CM | POA: Diagnosis not present

## 2019-06-13 DIAGNOSIS — E7800 Pure hypercholesterolemia, unspecified: Secondary | ICD-10-CM | POA: Diagnosis not present

## 2019-06-13 DIAGNOSIS — G4733 Obstructive sleep apnea (adult) (pediatric): Secondary | ICD-10-CM | POA: Diagnosis not present

## 2019-06-13 DIAGNOSIS — I2510 Atherosclerotic heart disease of native coronary artery without angina pectoris: Secondary | ICD-10-CM | POA: Diagnosis not present

## 2019-07-08 DIAGNOSIS — G4733 Obstructive sleep apnea (adult) (pediatric): Secondary | ICD-10-CM | POA: Diagnosis not present

## 2019-10-12 DIAGNOSIS — E119 Type 2 diabetes mellitus without complications: Secondary | ICD-10-CM | POA: Diagnosis not present

## 2019-10-12 DIAGNOSIS — Z1331 Encounter for screening for depression: Secondary | ICD-10-CM | POA: Diagnosis not present

## 2019-10-12 DIAGNOSIS — E785 Hyperlipidemia, unspecified: Secondary | ICD-10-CM | POA: Diagnosis not present

## 2019-10-12 DIAGNOSIS — I2510 Atherosclerotic heart disease of native coronary artery without angina pectoris: Secondary | ICD-10-CM | POA: Diagnosis not present

## 2019-10-12 DIAGNOSIS — E669 Obesity, unspecified: Secondary | ICD-10-CM | POA: Diagnosis not present

## 2019-10-12 DIAGNOSIS — Z0000 Encounter for general adult medical examination without abnormal findings: Secondary | ICD-10-CM | POA: Diagnosis not present

## 2019-10-12 DIAGNOSIS — G4730 Sleep apnea, unspecified: Secondary | ICD-10-CM | POA: Diagnosis not present

## 2019-11-09 DIAGNOSIS — Z20822 Contact with and (suspected) exposure to covid-19: Secondary | ICD-10-CM | POA: Diagnosis not present

## 2019-11-15 DIAGNOSIS — H8111 Benign paroxysmal vertigo, right ear: Secondary | ICD-10-CM | POA: Diagnosis not present

## 2019-11-22 DIAGNOSIS — H8112 Benign paroxysmal vertigo, left ear: Secondary | ICD-10-CM | POA: Diagnosis not present

## 2019-12-09 DIAGNOSIS — H8111 Benign paroxysmal vertigo, right ear: Secondary | ICD-10-CM | POA: Diagnosis not present

## 2019-12-14 DIAGNOSIS — H8110 Benign paroxysmal vertigo, unspecified ear: Secondary | ICD-10-CM | POA: Diagnosis not present

## 2019-12-14 DIAGNOSIS — G4733 Obstructive sleep apnea (adult) (pediatric): Secondary | ICD-10-CM | POA: Diagnosis not present

## 2019-12-14 DIAGNOSIS — E7800 Pure hypercholesterolemia, unspecified: Secondary | ICD-10-CM | POA: Diagnosis not present

## 2019-12-14 DIAGNOSIS — I2510 Atherosclerotic heart disease of native coronary artery without angina pectoris: Secondary | ICD-10-CM | POA: Diagnosis not present

## 2019-12-14 DIAGNOSIS — I10 Essential (primary) hypertension: Secondary | ICD-10-CM | POA: Diagnosis not present

## 2020-02-23 ENCOUNTER — Ambulatory Visit: Payer: Medicare HMO | Admitting: Dermatology

## 2020-02-23 ENCOUNTER — Other Ambulatory Visit: Payer: Self-pay

## 2020-02-23 DIAGNOSIS — L739 Follicular disorder, unspecified: Secondary | ICD-10-CM

## 2020-02-23 DIAGNOSIS — L578 Other skin changes due to chronic exposure to nonionizing radiation: Secondary | ICD-10-CM | POA: Diagnosis not present

## 2020-02-23 DIAGNOSIS — Z1283 Encounter for screening for malignant neoplasm of skin: Secondary | ICD-10-CM

## 2020-02-23 DIAGNOSIS — L814 Other melanin hyperpigmentation: Secondary | ICD-10-CM

## 2020-02-23 DIAGNOSIS — L719 Rosacea, unspecified: Secondary | ICD-10-CM

## 2020-02-23 DIAGNOSIS — L821 Other seborrheic keratosis: Secondary | ICD-10-CM | POA: Diagnosis not present

## 2020-02-23 DIAGNOSIS — D2339 Other benign neoplasm of skin of other parts of face: Secondary | ICD-10-CM | POA: Diagnosis not present

## 2020-02-23 DIAGNOSIS — D229 Melanocytic nevi, unspecified: Secondary | ICD-10-CM

## 2020-02-23 DIAGNOSIS — D1800 Hemangioma unspecified site: Secondary | ICD-10-CM

## 2020-02-23 DIAGNOSIS — L820 Inflamed seborrheic keratosis: Secondary | ICD-10-CM

## 2020-02-23 DIAGNOSIS — D2330 Other benign neoplasm of skin of unspecified part of face: Secondary | ICD-10-CM

## 2020-02-23 MED ORDER — CLINDAMYCIN PHOSPHATE 1 % EX SOLN: Freq: Two times a day (BID) | CUTANEOUS | 0 refills | Status: AC

## 2020-02-23 NOTE — Progress Notes (Addendum)
New Patient Visit  Subjective  Andre Schroeder is a 69 y.o. male who presents for the following: Spot Check (Pt has a few spots that he would like checked on his left forearm and his right eyebrow. He states that the spot on his right eyebrow comes and goes. ).  Both spots are irritating.  Objective  Well appearing patient in no apparent distress; mood and affect are within normal limits.  All skin waist up examined.  Objective  Left Forearm: Erythematous keratotic or waxy stuck-on papule or plaque.   Objective  right eyebrow: Perifollicular erythematous papules and pustules   Objective  face: Mid face erythema with telangiectasias + scattered inflammatory papules.   Objective  Right Eyebrow: Thickened scaly papule  Assessment & Plan  Inflamed seborrheic keratosis Left Forearm  Consistent with ISK with lichen simplex chronicus    Prior to procedure, discussed risks of blister formation, small wound, skin dyspigmentation, or rare scar following cryotherapy.    Destruction of lesion - Left Forearm  Destruction method: cryotherapy   Informed consent: discussed and consent obtained   Lesion destroyed using liquid nitrogen: Yes   Outcome: patient tolerated procedure well with no complications   Post-procedure details: wound care instructions given    Folliculitis right eyebrow  Folliculitis with lichen simplex chronicus  Start Clindamycin solution BID.   Prior to procedure, discussed risks of blister formation, small wound, skin dyspigmentation, or rare scar following cryotherapy.     Destruction of lesion - right eyebrow  Destruction method: cryotherapy   Informed consent: discussed and consent obtained   Lesion destroyed using liquid nitrogen: Yes   Outcome: patient tolerated procedure well with no complications   Post-procedure details: wound care instructions given    clindamycin (CLEOCIN T) 1 % external solution - right  eyebrow  Rosacea face  Pt deferred treatment today.  Benign neoplasm of skin of face Right Eyebrow  Favor lichen simplex chronicus, symptomatic  Prior to procedure, discussed risks of blister formation, small wound, skin dyspigmentation, or rare scar following cryotherapy.   Avoid rubbing   Destruction of lesion - Right Eyebrow  Destruction method: cryotherapy   Informed consent: discussed and consent obtained   Lesion destroyed using liquid nitrogen: Yes   Cryotherapy cycles:  2 Outcome: patient tolerated procedure well with no complications   Post-procedure details: wound care instructions given    Actinic Damage - chronic, secondary to cumulative UV radiation exposure/sun exposure over time - diffuse scaly erythematous macules with underlying dyspigmentation - Recommend daily broad spectrum sunscreen SPF 30+ to sun-exposed areas, reapply every 2 hours as needed.  - Call for new or changing lesions.  Lentigines - Scattered tan macules - Discussed due to sun exposure - Benign, observe - Call for any changes  Seborrheic Keratoses - Stuck-on, waxy, tan-brown papules and plaques  - Discussed benign etiology and prognosis. - Observe - Call for any changes  Melanocytic Nevi - Tan-brown and/or pink-flesh-colored symmetric macules and papules - Benign appearing on exam today - Observation - Call clinic for new or changing moles - Recommend daily use of broad spectrum spf 30+ sunscreen to sun-exposed areas.   Hemangiomas - Red papules - Discussed benign nature - Observe - Call for any changes  Skin cancer screening performed today.   Return in about 6 weeks (around 04/05/2020) for recheck ISK and folliculitis .   I, Harriett Sine, CMA, am acting as scribe for Forest Gleason, MD.  Documentation: I have reviewed the above documentation for  accuracy and completeness, and I agree with the above.  Forest Gleason, MD

## 2020-02-23 NOTE — Patient Instructions (Addendum)
Recommend daily broad spectrum sunscreen SPF 30+ to sun-exposed areas, reapply every 2 hours as needed. Call for new or changing lesions.    Recommend taking Heliocare sun protection supplement daily in sunny weather for additional sun protection. For maximum protection on the sunniest days, you can take up to 2 capsules of regular Heliocare OR take 1 capsule of Heliocare Ultra. For prolonged exposure (such as a full day in the sun), you can repeat your dose of the supplement 4 hours after your first dose. Heliocare can be purchased at [facility] or at [email or web address].     Melanoma ABCDEs  Melanoma is the most dangerous type of skin cancer, and is the leading cause of death from skin disease.  You are more likely to develop melanoma if you:  Have light-colored skin, light-colored eyes, or red or blond hair  Spend a lot of time in the sun  Tan regularly, either outdoors or in a tanning bed  Have had blistering sunburns, especially during childhood  Have a close family member who has had a melanoma  Have atypical moles or large birthmarks  Early detection of melanoma is key since treatment is typically straightforward and cure rates are extremely high if we catch it early.   The first sign of melanoma is often a change in a mole or a new dark spot.  The ABCDE system is a way of remembering the signs of melanoma.  A for asymmetry:  The two halves do not match. B for border:  The edges of the growth are irregular. C for color:  A mixture of colors are present instead of an even brown color. D for diameter:  Melanomas are usually (but not always) greater than 42mm - the size of a pencil eraser. E for evolution:  The spot keeps changing in size, shape, and color.  Please check your skin once per month between visits. You can use a small mirror in front and a large mirror behind you to keep an eye on the back side or your body.   If you see any new or changing lesions before  your next follow-up, please call to schedule a visit.  Please continue daily skin protection including broad spectrum sunscreen SPF 30+ to sun-exposed areas, reapplying every 2 hours as needed when you're outdoors.

## 2020-02-29 ENCOUNTER — Encounter: Payer: Self-pay | Admitting: Dermatology

## 2020-04-09 DIAGNOSIS — M7541 Impingement syndrome of right shoulder: Secondary | ICD-10-CM | POA: Diagnosis not present

## 2020-04-09 DIAGNOSIS — M19019 Primary osteoarthritis, unspecified shoulder: Secondary | ICD-10-CM | POA: Diagnosis not present

## 2020-04-11 ENCOUNTER — Other Ambulatory Visit: Payer: Self-pay

## 2020-04-11 ENCOUNTER — Ambulatory Visit: Payer: Medicare HMO | Admitting: Dermatology

## 2020-04-11 DIAGNOSIS — L853 Xerosis cutis: Secondary | ICD-10-CM

## 2020-04-11 DIAGNOSIS — D485 Neoplasm of uncertain behavior of skin: Secondary | ICD-10-CM

## 2020-04-11 DIAGNOSIS — L281 Prurigo nodularis: Secondary | ICD-10-CM | POA: Diagnosis not present

## 2020-04-11 NOTE — Progress Notes (Signed)
   Follow-Up Visit   Subjective  Andre Schroeder is a 70 y.o. male who presents for the following: folliculitis with LSC (Of the R brow - treated with Clindamycin solution BID and LN2. Patient unsure if he has noticed any improvement).  He is also bothered by scaly dry feet.   The following portions of the chart were reviewed this encounter and updated as appropriate:   Tobacco  Allergies  Meds  Problems  Med Hx  Surg Hx  Fam Hx     Review of Systems:  No other skin or systemic complaints except as noted in HPI or Assessment and Plan.  Objective  Well appearing patient in no apparent distress; mood and affect are within normal limits.  A focused examination was performed including the face and left forearm. Relevant physical exam findings are noted in the Assessment and Plan.  Objective  R brow: 1.8 cm scaly erythematous plaque     Objective  B/L foot: Dry skin   Assessment & Plan  Neoplasm of uncertain behavior of skin R brow  Skin / nail biopsy Type of biopsy: punch   Informed consent: discussed and consent obtained   Timeout: patient name, date of birth, surgical site, and procedure verified   Procedure prep:  Patient was prepped and draped in usual sterile fashion (the patient was cleaned and prepped) Prep type:  Isopropyl alcohol Anesthesia: the lesion was anesthetized in a standard fashion   Anesthetic:  1% lidocaine w/ epinephrine 1-100,000 buffered w/ 8.4% NaHCO3 Punch size:  3 mm Suture size:  5-0 Suture type: nylon   Hemostasis achieved with: suture, pressure and aluminum chloride   Outcome: patient tolerated procedure well   Post-procedure details: sterile dressing applied and wound care instructions given   Dressing type: bandage, petrolatum and pressure dressing   Additional details:  3 sutures - 2 simple and one horizontal mattress   Specimen 1 - Surgical pathology Differential Diagnosis: D48.5 r/o wart vs LSC vs SCC vs other Check Margins:  No 1.8 cm scaly erythematous plaque  Xerosis cutis B/L foot  Recommend Baby Foot treatment or Urea 20% cream daily  Return in about 1 week (around 04/18/2020) for suture removal .  I, Rudell Cobb, CMA, am acting as scribe for Forest Gleason, MD .  Documentation: I have reviewed the above documentation for accuracy and completeness, and I agree with the above.  Forest Gleason, MD

## 2020-04-11 NOTE — Patient Instructions (Signed)

## 2020-04-18 ENCOUNTER — Telehealth: Payer: Self-pay

## 2020-04-18 ENCOUNTER — Other Ambulatory Visit: Payer: Self-pay

## 2020-04-18 ENCOUNTER — Encounter: Payer: Self-pay | Admitting: Dermatology

## 2020-04-18 ENCOUNTER — Ambulatory Visit: Payer: Medicare HMO | Admitting: Dermatology

## 2020-04-18 ENCOUNTER — Ambulatory Visit (INDEPENDENT_AMBULATORY_CARE_PROVIDER_SITE_OTHER): Payer: Medicare HMO | Admitting: Dermatology

## 2020-04-18 DIAGNOSIS — L281 Prurigo nodularis: Secondary | ICD-10-CM

## 2020-04-18 DIAGNOSIS — Z4802 Encounter for removal of sutures: Secondary | ICD-10-CM

## 2020-04-18 DIAGNOSIS — H8111 Benign paroxysmal vertigo, right ear: Secondary | ICD-10-CM | POA: Diagnosis not present

## 2020-04-18 NOTE — Telephone Encounter (Signed)
Patient advised of BX results and will see Dr. Laurence Ferrari in clinic this afternoon at 1:30.

## 2020-04-18 NOTE — Progress Notes (Signed)
   Follow-Up Visit   Subjective  Andre Schroeder is a 70 y.o. male who presents for the following: Follow-up (Patient here today for suture removal and follow up for bx proven prurigo nodularis.  ).   The following portions of the chart were reviewed this encounter and updated as appropriate:   Tobacco  Allergies  Meds  Problems  Med Hx  Surg Hx  Fam Hx      Review of Systems:  No other skin or systemic complaints except as noted in HPI or Assessment and Plan.  Objective  Well appearing patient in no apparent distress; mood and affect are within normal limits.  A focused examination was performed including face. Relevant physical exam findings are noted in the Assessment and Plan.  Objective  right eyebrow: Bx proven   Assessment & Plan  Prurigo nodularis right eyebrow  Intralesional injection - right eyebrow Location: right eyebrow  Informed Consent: Discussed risks (infection, pain, bleeding, bruising, thinning of the skin, loss of skin pigment, lack of resolution, and recurrence of lesion) and benefits of the procedure, as well as the alternatives. Informed consent was obtained. Preparation: The area was prepared a standard fashion.  Procedure Details: An intralesional injection was performed with Kenalog 2 mg/cc. 0.2 cc in total were injected.  Total number of injections: 4  Plan: The patient was instructed on post-op care. Recommend OTC analgesia as needed for pain.    Encounter for Removal of Sutures - Incision site at the right brow is clean, dry and intact - Wound cleansed, sutures removed, wound cleansed and steri strips applied.  - Discussed pathology results showing prurigo nodularis  - Patient advised to keep steri-strips dry until they fall off. - Scars remodel for a full year. - Once steri-strips fall off, patient can apply over-the-counter silicone scar cream each night to help with scar remodeling if desired. - Patient advised to call with any  concerns or if they notice any new or changing lesions.   Return in about 6 weeks (around 05/30/2020).  Graciella Belton, RMA, am acting as scribe for Forest Gleason, MD .  Documentation: I have reviewed the above documentation for accuracy and completeness, and I agree with the above.  Forest Gleason, MD

## 2020-04-18 NOTE — Telephone Encounter (Signed)
-----   Message from [place], MD sent at 04/17/2020 10:39 AM EST ----- Skin (M), right brow PRURIGO NODULARIS This is a spot where the skin has thickened from rubbing.  Recommend injecting steroid into spot or freezing in clinic at follow-up.  MAs please call. Thank you!

## 2020-05-01 ENCOUNTER — Encounter: Payer: Self-pay | Admitting: Dermatology

## 2020-05-09 DIAGNOSIS — H8111 Benign paroxysmal vertigo, right ear: Secondary | ICD-10-CM | POA: Diagnosis not present

## 2020-05-09 DIAGNOSIS — H93292 Other abnormal auditory perceptions, left ear: Secondary | ICD-10-CM | POA: Diagnosis not present

## 2020-05-18 DIAGNOSIS — H8111 Benign paroxysmal vertigo, right ear: Secondary | ICD-10-CM | POA: Diagnosis not present

## 2020-06-04 DIAGNOSIS — E119 Type 2 diabetes mellitus without complications: Secondary | ICD-10-CM | POA: Diagnosis not present

## 2020-06-04 DIAGNOSIS — Z0100 Encounter for examination of eyes and vision without abnormal findings: Secondary | ICD-10-CM | POA: Diagnosis not present

## 2020-06-04 DIAGNOSIS — E1136 Type 2 diabetes mellitus with diabetic cataract: Secondary | ICD-10-CM | POA: Diagnosis not present

## 2020-06-06 ENCOUNTER — Ambulatory Visit: Payer: Medicare HMO | Admitting: Dermatology

## 2020-06-06 ENCOUNTER — Encounter: Payer: Self-pay | Admitting: Dermatology

## 2020-06-06 ENCOUNTER — Other Ambulatory Visit: Payer: Self-pay

## 2020-06-06 DIAGNOSIS — L280 Lichen simplex chronicus: Secondary | ICD-10-CM

## 2020-06-06 DIAGNOSIS — L281 Prurigo nodularis: Secondary | ICD-10-CM | POA: Diagnosis not present

## 2020-06-06 MED ORDER — HYDROCORTISONE 2.5 % EX OINT: TOPICAL_OINTMENT | Freq: Two times a day (BID) | CUTANEOUS | 0 refills | Status: AC

## 2020-06-06 NOTE — Patient Instructions (Addendum)
Start HC 2.5% ointment to right eyebrow twice daily for up to 2 weeks as needed for itch. Avoid applying to face, groin, and axilla. Use as directed. Risk of skin atrophy with long-term use reviewed.   Topical steroids (such as triamcinolone, fluocinolone, fluocinonide, mometasone, clobetasol, halobetasol, betamethasone, hydrocortisone) can cause thinning and lightening of the skin if they are used for too long in the same area. Your physician has selected the right strength medicine for your problem and area affected on the body. Please use your medication only as directed by your physician to prevent side effects.   If you have any questions or concerns for your doctor, please call our main line at [phone] and press option 4 to reach your doctor's medical assistant. If no one answers, please leave a voicemail as directed and we will return your call as soon as possible. Messages left after 4 pm will be answered the following business day.   You may also send [place] a message via [facility]. We typically respond to MyChart messages within 1-2 business days.  For prescription refills, please ask your pharmacy to contact our office. Our fax number is [phone].  If you have an urgent issue when the clinic is closed that cannot wait until the next business day, you can page your doctor at the number below.    Please note that while we do our best to be available for urgent issues outside of office hours, we are not available 24/7.   If you have an urgent issue and are unable to reach [place], you may choose to seek medical care at your doctor's office, retail clinic, urgent care center, or emergency room.  If you have a medical emergency, please immediately call 911 or go to the emergency department.  Pager Numbers  - Dr. Nehemiah Massed: [phone]  - Dr. Laurence Ferrari: [phone]  - Dr. Nicole Kindred: [phone]  In the event of inclement weather, please call our main line at [phone] for an update on the  status of any delays or closures.  Dermatology Medication Tips: Please keep the boxes that topical medications come in in order to help keep track of the instructions about where and how to use these. Pharmacies typically print the medication instructions only on the boxes and not directly on the medication tubes.   If your medication is too expensive, please contact our office at [phone] option 4 or send [place] a message through [facility].   We are unable to tell what your co-pay for medications will be in advance as this is different depending on your insurance coverage. However, we may be able to find a substitute medication at lower cost or fill out paperwork to get insurance to cover a needed medication.   If a prior authorization is required to get your medication covered by your insurance company, please allow [place] 1-2 business days to complete this process.  Drug prices often vary depending on where the prescription is filled and some pharmacies may offer cheaper prices.  The website www.goodrx.com contains coupons for medications through different pharmacies. The prices here do not account for what the cost may be with help from insurance (it may be cheaper with your insurance), but the website can give you the price if you did not use any insurance.  - You can print the associated coupon and take it with your prescription to the pharmacy.  - You may also stop by our office during regular business hours and pick up a GoodRx coupon card.  -  If you need your prescription sent electronically to a different pharmacy, notify our office through [facility] MyChart or by phone at [phone] option 4.  

## 2020-06-06 NOTE — Progress Notes (Signed)
   Follow-Up Visit   Subjective  Andre Schroeder is a 70 y.o. male who presents for the following: Follow-up (Patient here today for 6 week follow up at right eyebrow for bx proven prurigo nodularis, previously injected with ILK. ).  Patient advises area has improved.   The following portions of the chart were reviewed this encounter and updated as appropriate:   Tobacco  Allergies  Meds  Problems  Med Hx  Surg Hx  Fam Hx      Review of Systems:  No other skin or systemic complaints except as noted in HPI or Assessment and Plan.  Objective  Well appearing patient in no apparent distress; mood and affect are within normal limits.  A focused examination was performed including face. Relevant physical exam findings are noted in the Assessment and Plan.  Objective  Right Eyebrow: Lichenified firm plaque  Objective  Head - Anterior (Face): Lichenified plaque   Assessment & Plan  Prurigo nodularis Right Eyebrow  Biopsy proven  Intralesional injection - Right Eyebrow Location: right eyebrow  Informed Consent: Discussed risks (infection, pain, bleeding, bruising, thinning of the skin, loss of skin pigment, lack of resolution, and recurrence of lesion) and benefits of the procedure, as well as the alternatives. Informed consent was obtained. Preparation: The area was prepared a standard fashion.  Procedure Details: An intralesional injection was performed with Kenalog 3.3 mg/cc. 0.1 cc in total were injected.  Total number of injections: 3  Plan: The patient was instructed on post-op care. Recommend OTC analgesia as needed for pain.   Ordered Medications: hydrocortisone 2.5 % ointment  Lichen simplex chronicus Head - Anterior (Face)  Start HC 2.5% ointment to AA BID for up to 2 weeks PRN itch. Avoid applying to face, groin, and axilla. Use as directed. Risk of skin atrophy with long-term use reviewed.   Topical steroids (such as triamcinolone, fluocinolone,  fluocinonide, mometasone, clobetasol, halobetasol, betamethasone, hydrocortisone) can cause thinning and lightening of the skin if they are used for too long in the same area. Your physician has selected the right strength medicine for your problem and area affected on the body. Please use your medication only as directed by your physician to prevent side effects.    Return if symptoms worsen or fail to improve.  Graciella Belton, RMA, am acting as scribe for Forest Gleason, MD .  Documentation: I have reviewed the above documentation for accuracy and completeness, and I agree with the above.  Forest Gleason, MD

## 2020-06-13 DIAGNOSIS — I2510 Atherosclerotic heart disease of native coronary artery without angina pectoris: Secondary | ICD-10-CM | POA: Diagnosis not present

## 2020-06-13 DIAGNOSIS — E7800 Pure hypercholesterolemia, unspecified: Secondary | ICD-10-CM | POA: Diagnosis not present

## 2020-06-13 DIAGNOSIS — E119 Type 2 diabetes mellitus without complications: Secondary | ICD-10-CM | POA: Diagnosis not present

## 2020-06-13 DIAGNOSIS — G4733 Obstructive sleep apnea (adult) (pediatric): Secondary | ICD-10-CM | POA: Diagnosis not present

## 2020-06-13 DIAGNOSIS — I10 Essential (primary) hypertension: Secondary | ICD-10-CM | POA: Diagnosis not present

## 2020-06-29 DIAGNOSIS — H8112 Benign paroxysmal vertigo, left ear: Secondary | ICD-10-CM | POA: Diagnosis not present

## 2020-09-03 DIAGNOSIS — Z20822 Contact with and (suspected) exposure to covid-19: Secondary | ICD-10-CM | POA: Diagnosis not present

## 2020-09-03 DIAGNOSIS — Z03818 Encounter for observation for suspected exposure to other biological agents ruled out: Secondary | ICD-10-CM | POA: Diagnosis not present

## 2020-11-29 DIAGNOSIS — E119 Type 2 diabetes mellitus without complications: Secondary | ICD-10-CM | POA: Diagnosis not present

## 2020-11-29 DIAGNOSIS — I10 Essential (primary) hypertension: Secondary | ICD-10-CM | POA: Diagnosis not present

## 2020-11-29 DIAGNOSIS — Z125 Encounter for screening for malignant neoplasm of prostate: Secondary | ICD-10-CM | POA: Diagnosis not present

## 2020-11-29 DIAGNOSIS — E7800 Pure hypercholesterolemia, unspecified: Secondary | ICD-10-CM | POA: Diagnosis not present

## 2020-12-06 DIAGNOSIS — E119 Type 2 diabetes mellitus without complications: Secondary | ICD-10-CM | POA: Diagnosis not present

## 2020-12-06 DIAGNOSIS — I10 Essential (primary) hypertension: Secondary | ICD-10-CM | POA: Diagnosis not present

## 2020-12-06 DIAGNOSIS — I2510 Atherosclerotic heart disease of native coronary artery without angina pectoris: Secondary | ICD-10-CM | POA: Diagnosis not present

## 2020-12-06 DIAGNOSIS — Z1331 Encounter for screening for depression: Secondary | ICD-10-CM | POA: Diagnosis not present

## 2020-12-06 DIAGNOSIS — Z0000 Encounter for general adult medical examination without abnormal findings: Secondary | ICD-10-CM | POA: Diagnosis not present

## 2020-12-06 DIAGNOSIS — E785 Hyperlipidemia, unspecified: Secondary | ICD-10-CM | POA: Diagnosis not present

## 2020-12-17 DIAGNOSIS — R42 Dizziness and giddiness: Secondary | ICD-10-CM | POA: Diagnosis not present

## 2020-12-19 DIAGNOSIS — I10 Essential (primary) hypertension: Secondary | ICD-10-CM | POA: Diagnosis not present

## 2020-12-19 DIAGNOSIS — G4733 Obstructive sleep apnea (adult) (pediatric): Secondary | ICD-10-CM | POA: Diagnosis not present

## 2020-12-19 DIAGNOSIS — E119 Type 2 diabetes mellitus without complications: Secondary | ICD-10-CM | POA: Diagnosis not present

## 2020-12-19 DIAGNOSIS — I2510 Atherosclerotic heart disease of native coronary artery without angina pectoris: Secondary | ICD-10-CM | POA: Diagnosis not present

## 2020-12-19 DIAGNOSIS — E7800 Pure hypercholesterolemia, unspecified: Secondary | ICD-10-CM | POA: Diagnosis not present

## 2021-04-04 DIAGNOSIS — E119 Type 2 diabetes mellitus without complications: Secondary | ICD-10-CM | POA: Diagnosis not present

## 2021-04-08 DIAGNOSIS — E7800 Pure hypercholesterolemia, unspecified: Secondary | ICD-10-CM | POA: Diagnosis not present

## 2021-04-08 DIAGNOSIS — Z8669 Personal history of other diseases of the nervous system and sense organs: Secondary | ICD-10-CM | POA: Diagnosis not present

## 2021-04-08 DIAGNOSIS — E119 Type 2 diabetes mellitus without complications: Secondary | ICD-10-CM | POA: Diagnosis not present

## 2021-04-08 DIAGNOSIS — I10 Essential (primary) hypertension: Secondary | ICD-10-CM | POA: Diagnosis not present

## 2021-04-24 DIAGNOSIS — G4733 Obstructive sleep apnea (adult) (pediatric): Secondary | ICD-10-CM | POA: Diagnosis not present

## 2021-06-11 DIAGNOSIS — G4733 Obstructive sleep apnea (adult) (pediatric): Secondary | ICD-10-CM | POA: Diagnosis not present

## 2021-06-11 DIAGNOSIS — I10 Essential (primary) hypertension: Secondary | ICD-10-CM | POA: Diagnosis not present

## 2021-06-11 DIAGNOSIS — E7800 Pure hypercholesterolemia, unspecified: Secondary | ICD-10-CM | POA: Diagnosis not present

## 2021-06-11 DIAGNOSIS — I2510 Atherosclerotic heart disease of native coronary artery without angina pectoris: Secondary | ICD-10-CM | POA: Diagnosis not present

## 2021-08-01 DIAGNOSIS — E119 Type 2 diabetes mellitus without complications: Secondary | ICD-10-CM | POA: Diagnosis not present

## 2021-08-01 DIAGNOSIS — I10 Essential (primary) hypertension: Secondary | ICD-10-CM | POA: Diagnosis not present

## 2021-08-06 DIAGNOSIS — I2510 Atherosclerotic heart disease of native coronary artery without angina pectoris: Secondary | ICD-10-CM | POA: Diagnosis not present

## 2021-08-06 DIAGNOSIS — E7800 Pure hypercholesterolemia, unspecified: Secondary | ICD-10-CM | POA: Diagnosis not present

## 2021-08-06 DIAGNOSIS — E119 Type 2 diabetes mellitus without complications: Secondary | ICD-10-CM | POA: Diagnosis not present

## 2021-08-06 DIAGNOSIS — G4733 Obstructive sleep apnea (adult) (pediatric): Secondary | ICD-10-CM | POA: Diagnosis not present

## 2021-08-28 DIAGNOSIS — J069 Acute upper respiratory infection, unspecified: Secondary | ICD-10-CM | POA: Diagnosis not present

## 2021-08-28 DIAGNOSIS — R051 Acute cough: Secondary | ICD-10-CM | POA: Diagnosis not present

## 2021-11-06 DIAGNOSIS — I10 Essential (primary) hypertension: Secondary | ICD-10-CM | POA: Diagnosis not present

## 2021-11-06 DIAGNOSIS — I25118 Atherosclerotic heart disease of native coronary artery with other forms of angina pectoris: Secondary | ICD-10-CM | POA: Diagnosis not present

## 2021-11-06 DIAGNOSIS — I2510 Atherosclerotic heart disease of native coronary artery without angina pectoris: Secondary | ICD-10-CM | POA: Diagnosis not present

## 2021-11-06 DIAGNOSIS — E7800 Pure hypercholesterolemia, unspecified: Secondary | ICD-10-CM | POA: Diagnosis not present

## 2021-11-06 DIAGNOSIS — E119 Type 2 diabetes mellitus without complications: Secondary | ICD-10-CM | POA: Diagnosis not present

## 2021-11-06 DIAGNOSIS — G4733 Obstructive sleep apnea (adult) (pediatric): Secondary | ICD-10-CM | POA: Diagnosis not present

## 2021-11-27 DIAGNOSIS — I25118 Atherosclerotic heart disease of native coronary artery with other forms of angina pectoris: Secondary | ICD-10-CM | POA: Diagnosis not present

## 2022-01-09 DIAGNOSIS — I2510 Atherosclerotic heart disease of native coronary artery without angina pectoris: Secondary | ICD-10-CM | POA: Diagnosis not present

## 2022-01-09 DIAGNOSIS — G4733 Obstructive sleep apnea (adult) (pediatric): Secondary | ICD-10-CM | POA: Diagnosis not present

## 2022-01-09 DIAGNOSIS — I10 Essential (primary) hypertension: Secondary | ICD-10-CM | POA: Diagnosis not present

## 2022-01-09 DIAGNOSIS — E7800 Pure hypercholesterolemia, unspecified: Secondary | ICD-10-CM | POA: Diagnosis not present

## 2022-01-09 DIAGNOSIS — E119 Type 2 diabetes mellitus without complications: Secondary | ICD-10-CM | POA: Diagnosis not present

## 2022-02-07 DIAGNOSIS — G4733 Obstructive sleep apnea (adult) (pediatric): Secondary | ICD-10-CM | POA: Diagnosis not present

## 2022-03-19 DIAGNOSIS — E782 Mixed hyperlipidemia: Secondary | ICD-10-CM | POA: Diagnosis not present

## 2022-03-19 DIAGNOSIS — I2510 Atherosclerotic heart disease of native coronary artery without angina pectoris: Secondary | ICD-10-CM | POA: Diagnosis not present

## 2022-05-20 DIAGNOSIS — I2510 Atherosclerotic heart disease of native coronary artery without angina pectoris: Secondary | ICD-10-CM | POA: Diagnosis not present

## 2022-05-20 DIAGNOSIS — Z0000 Encounter for general adult medical examination without abnormal findings: Secondary | ICD-10-CM | POA: Diagnosis not present

## 2022-05-20 DIAGNOSIS — Z1331 Encounter for screening for depression: Secondary | ICD-10-CM | POA: Diagnosis not present

## 2022-05-20 DIAGNOSIS — I25118 Atherosclerotic heart disease of native coronary artery with other forms of angina pectoris: Secondary | ICD-10-CM | POA: Diagnosis not present

## 2022-05-20 DIAGNOSIS — Z125 Encounter for screening for malignant neoplasm of prostate: Secondary | ICD-10-CM | POA: Diagnosis not present

## 2022-05-20 DIAGNOSIS — E119 Type 2 diabetes mellitus without complications: Secondary | ICD-10-CM | POA: Diagnosis not present

## 2022-05-20 DIAGNOSIS — I10 Essential (primary) hypertension: Secondary | ICD-10-CM | POA: Diagnosis not present

## 2022-05-20 DIAGNOSIS — E7800 Pure hypercholesterolemia, unspecified: Secondary | ICD-10-CM | POA: Diagnosis not present

## 2022-05-20 DIAGNOSIS — G4733 Obstructive sleep apnea (adult) (pediatric): Secondary | ICD-10-CM | POA: Diagnosis not present

## 2022-05-21 DIAGNOSIS — Z0100 Encounter for examination of eyes and vision without abnormal findings: Secondary | ICD-10-CM | POA: Diagnosis not present

## 2022-05-21 DIAGNOSIS — H5213 Myopia, bilateral: Secondary | ICD-10-CM | POA: Diagnosis not present

## 2022-12-01 DIAGNOSIS — I10 Essential (primary) hypertension: Secondary | ICD-10-CM | POA: Diagnosis not present

## 2022-12-01 DIAGNOSIS — E785 Hyperlipidemia, unspecified: Secondary | ICD-10-CM | POA: Diagnosis not present

## 2022-12-01 DIAGNOSIS — E1169 Type 2 diabetes mellitus with other specified complication: Secondary | ICD-10-CM | POA: Diagnosis not present

## 2022-12-01 DIAGNOSIS — Z008 Encounter for other general examination: Secondary | ICD-10-CM | POA: Diagnosis not present

## 2022-12-01 DIAGNOSIS — R2681 Unsteadiness on feet: Secondary | ICD-10-CM | POA: Diagnosis not present

## 2022-12-01 DIAGNOSIS — Z6837 Body mass index (BMI) 37.0-37.9, adult: Secondary | ICD-10-CM | POA: Diagnosis not present

## 2022-12-01 DIAGNOSIS — K219 Gastro-esophageal reflux disease without esophagitis: Secondary | ICD-10-CM | POA: Diagnosis not present

## 2022-12-22 DIAGNOSIS — H8111 Benign paroxysmal vertigo, right ear: Secondary | ICD-10-CM | POA: Diagnosis not present

## 2022-12-22 DIAGNOSIS — H903 Sensorineural hearing loss, bilateral: Secondary | ICD-10-CM | POA: Diagnosis not present

## 2022-12-22 DIAGNOSIS — H8113 Benign paroxysmal vertigo, bilateral: Secondary | ICD-10-CM | POA: Diagnosis not present

## 2022-12-31 DIAGNOSIS — H8111 Benign paroxysmal vertigo, right ear: Secondary | ICD-10-CM | POA: Diagnosis not present

## 2023-01-07 DIAGNOSIS — H8111 Benign paroxysmal vertigo, right ear: Secondary | ICD-10-CM | POA: Diagnosis not present

## 2023-01-14 DIAGNOSIS — H8111 Benign paroxysmal vertigo, right ear: Secondary | ICD-10-CM | POA: Diagnosis not present

## 2023-02-10 DIAGNOSIS — I2510 Atherosclerotic heart disease of native coronary artery without angina pectoris: Secondary | ICD-10-CM | POA: Diagnosis not present

## 2023-02-10 DIAGNOSIS — E119 Type 2 diabetes mellitus without complications: Secondary | ICD-10-CM | POA: Diagnosis not present

## 2023-02-10 DIAGNOSIS — I4510 Unspecified right bundle-branch block: Secondary | ICD-10-CM | POA: Diagnosis not present

## 2023-02-10 DIAGNOSIS — I10 Essential (primary) hypertension: Secondary | ICD-10-CM | POA: Diagnosis not present

## 2023-02-10 DIAGNOSIS — R0602 Shortness of breath: Secondary | ICD-10-CM | POA: Diagnosis not present

## 2023-02-10 DIAGNOSIS — G4733 Obstructive sleep apnea (adult) (pediatric): Secondary | ICD-10-CM | POA: Diagnosis not present

## 2023-02-10 DIAGNOSIS — E7800 Pure hypercholesterolemia, unspecified: Secondary | ICD-10-CM | POA: Diagnosis not present

## 2023-02-19 DIAGNOSIS — H8111 Benign paroxysmal vertigo, right ear: Secondary | ICD-10-CM | POA: Diagnosis not present

## 2023-02-26 DIAGNOSIS — R0602 Shortness of breath: Secondary | ICD-10-CM | POA: Diagnosis not present

## 2023-02-26 DIAGNOSIS — I2510 Atherosclerotic heart disease of native coronary artery without angina pectoris: Secondary | ICD-10-CM | POA: Diagnosis not present

## 2023-02-26 DIAGNOSIS — I4510 Unspecified right bundle-branch block: Secondary | ICD-10-CM | POA: Diagnosis not present

## 2023-03-16 ENCOUNTER — Other Ambulatory Visit (HOSPITAL_COMMUNITY): Payer: Self-pay

## 2023-03-20 ENCOUNTER — Other Ambulatory Visit (HOSPITAL_COMMUNITY): Payer: Self-pay

## 2023-03-20 MED ORDER — OZEMPIC (0.25 OR 0.5 MG/DOSE) 2 MG/3ML ~~LOC~~ SOPN
0.2500 mg | PEN_INJECTOR | SUBCUTANEOUS | 0 refills | Status: DC
  Filled 2023-03-20: qty 3, 56d supply, fill #0
  Filled 2023-05-18: qty 3, 56d supply, fill #1
  Filled 2023-07-10: qty 3, 56d supply, fill #2

## 2023-03-23 ENCOUNTER — Other Ambulatory Visit: Payer: Self-pay

## 2023-03-25 ENCOUNTER — Other Ambulatory Visit: Payer: Self-pay

## 2023-03-25 ENCOUNTER — Other Ambulatory Visit (HOSPITAL_COMMUNITY): Payer: Self-pay

## 2023-03-26 ENCOUNTER — Other Ambulatory Visit (HOSPITAL_COMMUNITY): Payer: Self-pay

## 2023-05-01 DIAGNOSIS — H8111 Benign paroxysmal vertigo, right ear: Secondary | ICD-10-CM | POA: Diagnosis not present

## 2023-05-18 ENCOUNTER — Other Ambulatory Visit: Payer: Self-pay

## 2023-05-18 ENCOUNTER — Other Ambulatory Visit (HOSPITAL_COMMUNITY): Payer: Self-pay

## 2023-06-08 ENCOUNTER — Other Ambulatory Visit (HOSPITAL_COMMUNITY): Payer: Self-pay

## 2023-07-10 ENCOUNTER — Other Ambulatory Visit (HOSPITAL_COMMUNITY): Payer: Self-pay

## 2023-08-28 DIAGNOSIS — G4733 Obstructive sleep apnea (adult) (pediatric): Secondary | ICD-10-CM | POA: Diagnosis not present

## 2023-09-07 ENCOUNTER — Other Ambulatory Visit (HOSPITAL_COMMUNITY): Payer: Self-pay

## 2023-09-09 ENCOUNTER — Other Ambulatory Visit: Payer: Self-pay

## 2023-09-09 ENCOUNTER — Other Ambulatory Visit (HOSPITAL_COMMUNITY): Payer: Self-pay

## 2023-09-09 MED ORDER — OZEMPIC (0.25 OR 0.5 MG/DOSE) 2 MG/3ML ~~LOC~~ SOPN
0.2500 mg | PEN_INJECTOR | SUBCUTANEOUS | 0 refills | Status: DC
  Filled 2023-09-09 (×2): qty 3, 56d supply, fill #0

## 2023-09-15 DIAGNOSIS — R42 Dizziness and giddiness: Secondary | ICD-10-CM | POA: Diagnosis not present

## 2023-10-14 DIAGNOSIS — H8110 Benign paroxysmal vertigo, unspecified ear: Secondary | ICD-10-CM | POA: Diagnosis not present

## 2023-10-14 DIAGNOSIS — Z0000 Encounter for general adult medical examination without abnormal findings: Secondary | ICD-10-CM | POA: Diagnosis not present

## 2023-10-14 DIAGNOSIS — I2510 Atherosclerotic heart disease of native coronary artery without angina pectoris: Secondary | ICD-10-CM | POA: Diagnosis not present

## 2023-10-14 DIAGNOSIS — Z1331 Encounter for screening for depression: Secondary | ICD-10-CM | POA: Diagnosis not present

## 2023-10-14 DIAGNOSIS — E7800 Pure hypercholesterolemia, unspecified: Secondary | ICD-10-CM | POA: Diagnosis not present

## 2023-10-14 DIAGNOSIS — E119 Type 2 diabetes mellitus without complications: Secondary | ICD-10-CM | POA: Diagnosis not present

## 2023-10-14 DIAGNOSIS — G4733 Obstructive sleep apnea (adult) (pediatric): Secondary | ICD-10-CM | POA: Diagnosis not present

## 2023-10-14 DIAGNOSIS — Z125 Encounter for screening for malignant neoplasm of prostate: Secondary | ICD-10-CM | POA: Diagnosis not present

## 2023-10-14 DIAGNOSIS — I10 Essential (primary) hypertension: Secondary | ICD-10-CM | POA: Diagnosis not present

## 2023-10-21 DIAGNOSIS — E7800 Pure hypercholesterolemia, unspecified: Secondary | ICD-10-CM | POA: Diagnosis not present

## 2023-10-21 DIAGNOSIS — I10 Essential (primary) hypertension: Secondary | ICD-10-CM | POA: Diagnosis not present

## 2023-10-21 DIAGNOSIS — E119 Type 2 diabetes mellitus without complications: Secondary | ICD-10-CM | POA: Diagnosis not present

## 2023-10-21 DIAGNOSIS — Z125 Encounter for screening for malignant neoplasm of prostate: Secondary | ICD-10-CM | POA: Diagnosis not present

## 2023-12-17 DIAGNOSIS — H8111 Benign paroxysmal vertigo, right ear: Secondary | ICD-10-CM | POA: Diagnosis not present

## 2023-12-24 DIAGNOSIS — H8111 Benign paroxysmal vertigo, right ear: Secondary | ICD-10-CM | POA: Diagnosis not present

## 2024-01-29 DIAGNOSIS — G4733 Obstructive sleep apnea (adult) (pediatric): Secondary | ICD-10-CM | POA: Diagnosis not present

## 2024-02-08 ENCOUNTER — Ambulatory Visit: Admitting: Anesthesiology

## 2024-02-08 ENCOUNTER — Other Ambulatory Visit: Payer: Self-pay

## 2024-02-08 ENCOUNTER — Encounter
Admission: RE | Disposition: A | Discharge status: Home / Self Care | Payer: Self-pay | Source: Home / Self Care | Attending: Gastroenterology

## 2024-02-08 ENCOUNTER — Ambulatory Visit
Admission: RE | Admit: 2024-02-08 | Discharge: 2024-02-08 | Disposition: A | Discharge status: Home / Self Care | Attending: Gastroenterology | Admitting: Gastroenterology

## 2024-02-08 DIAGNOSIS — Z860101 Personal history of adenomatous and serrated colon polyps: Secondary | ICD-10-CM | POA: Diagnosis not present

## 2024-02-08 DIAGNOSIS — I10 Essential (primary) hypertension: Secondary | ICD-10-CM | POA: Diagnosis not present

## 2024-02-08 DIAGNOSIS — K219 Gastro-esophageal reflux disease without esophagitis: Secondary | ICD-10-CM | POA: Diagnosis not present

## 2024-02-08 DIAGNOSIS — K640 First degree hemorrhoids: Secondary | ICD-10-CM | POA: Diagnosis not present

## 2024-02-08 DIAGNOSIS — G4733 Obstructive sleep apnea (adult) (pediatric): Secondary | ICD-10-CM | POA: Diagnosis not present

## 2024-02-08 DIAGNOSIS — Z7984 Long term (current) use of oral hypoglycemic drugs: Secondary | ICD-10-CM | POA: Diagnosis not present

## 2024-02-08 DIAGNOSIS — Z1211 Encounter for screening for malignant neoplasm of colon: Secondary | ICD-10-CM | POA: Diagnosis not present

## 2024-02-08 DIAGNOSIS — K5730 Diverticulosis of large intestine without perforation or abscess without bleeding: Secondary | ICD-10-CM | POA: Diagnosis not present

## 2024-02-08 DIAGNOSIS — K649 Unspecified hemorrhoids: Secondary | ICD-10-CM | POA: Diagnosis not present

## 2024-02-08 DIAGNOSIS — K635 Polyp of colon: Secondary | ICD-10-CM | POA: Diagnosis not present

## 2024-02-08 DIAGNOSIS — I252 Old myocardial infarction: Secondary | ICD-10-CM | POA: Diagnosis not present

## 2024-02-08 DIAGNOSIS — E119 Type 2 diabetes mellitus without complications: Secondary | ICD-10-CM | POA: Diagnosis not present

## 2024-02-08 DIAGNOSIS — I2510 Atherosclerotic heart disease of native coronary artery without angina pectoris: Secondary | ICD-10-CM | POA: Diagnosis not present

## 2024-02-08 HISTORY — PX: POLYPECTOMY: SHX149

## 2024-02-08 HISTORY — PX: COLONOSCOPY: SHX5424

## 2024-02-08 LAB — GLUCOSE, CAPILLARY: Glucose-Capillary: 129 mg/dL — ABNORMAL HIGH (ref 70–99)

## 2024-02-08 SURGERY — COLONOSCOPY
Anesthesia: General

## 2024-02-08 MED ORDER — SODIUM CHLORIDE 0.9 % IV SOLN: INTRAVENOUS | Status: DC

## 2024-02-08 MED ORDER — PROPOFOL 500 MG/50ML IV EMUL
INTRAVENOUS | Status: DC | PRN
  Administered 2024-02-08: 150 ug/kg/min via INTRAVENOUS

## 2024-02-08 NOTE — H&P (Signed)
 Outpatient short stay form Pre-procedure 02/08/2024  [name], MD  Primary Physician: Glover Lenis, MD  Reason for visit:  Surveillance  History of present illness:    73 y/o gentleman with history of obesity, hypertension, and OSA here for surveillance colonoscopy. Last colonoscopy in 2020 with small TA. No blood thinners. No family history of GI malignancies. No significant abdominal surgeries.    Current Facility-Administered Medications:    0.9 %  sodium chloride  infusion, , Intravenous, Continuous, Phinneas Shakoor, Ole ONEIDA, MD, Last Rate: 20 mL/hr at 02/08/24 1208, New Bag at 02/08/24 1208  Medications Prior to Admission  Medication Sig Dispense Refill Last Dose/Taking   aspirin EC 81 MG tablet Take 81 mg by mouth daily.   Past Week   atorvastatin (LIPITOR) 20 MG tablet Take 20 mg by mouth daily.   02/08/2024   carvedilol (COREG) 3.125 MG tablet Take 1 tablet by mouth 2 (two) times daily.   02/08/2024   esomeprazole (NEXIUM) 20 MG capsule Take 20 mg by mouth daily as needed.   02/08/2024   metFORMIN (GLUCOPHAGE-XR) 500 MG 24 hr tablet Take 500 mg by mouth daily.    Past Week   valsartan (DIOVAN) 80 MG tablet Take 80 mg by mouth daily.   02/07/2024   Accu-Chek FastClix Lancets MISC       ACCU-CHEK GUIDE test strip       Blood Glucose Monitoring Suppl (GLUCOCOM BLOOD GLUCOSE MONITOR) DEVI 1 each by XX route as directed      clindamycin  (CLEOCIN  T) 1 % external solution Apply topically 2 (two) times daily. 30 mL 0    clopidogrel (PLAVIX) 75 MG tablet  (Patient not taking: Reported on 02/08/2024)   Not Taking   hydrocortisone  2.5 % ointment Apply topically 2 (two) times daily. To right eyebrow for up to 2 weeks 30 g 0    nitroGLYCERIN (NITROSTAT) 0.4 MG SL tablet Place under the tongue.      polyethylene glycol-electrolytes (NULYTELY/GOLYTELY) 420 g solution Take as directed for colonic prep.      Semaglutide ,0.25 or 0.5MG /DOS, (OZEMPIC , 0.25 OR 0.5 MG/DOSE,) 2 MG/3ML SOPN  Inject 0.25 mg into the skin every 7 (seven) days. **Needs appt** 3 mL 0 01/29/2024     No Known Allergies   Past Medical History:  Diagnosis Date   Actinic keratosis    CAD (coronary artery disease)    Diabetes mellitus without complication (HCC)    GERD (gastroesophageal reflux disease)    Hyperlipidemia    Hypertension    Myocardial infarction (HCC)    Pure hypercholesterolemia    Sleep apnea    Squamous cell carcinoma of skin 12/02/2007   Right postauricular. Pigmented SCCis.   Vertigo     Review of systems:  Otherwise negative.    Physical Exam  Gen: Alert, oriented. Appears stated age.  HEENT: PERRLA. Lungs: No respiratory distress CV: RRR Abd: soft, benign, no masses Ext: No edema    Planned procedures: Proceed with colonoscopy. The patient understands the nature of the planned procedure, indications, risks, alternatives and potential complications including but not limited to bleeding, infection, perforation, damage to internal organs and possible oversedation/side effects from anesthesia. The patient agrees and gives consent to proceed.  Please refer to procedure notes for findings, recommendations and patient disposition/instructions.     [name], MD [facility]

## 2024-02-08 NOTE — Anesthesia Preprocedure Evaluation (Signed)
 Anesthesia Evaluation  Patient identified by MRN, date of birth, ID band Patient awake    Reviewed: Allergy & Precautions, NPO status , Patient's Chart, lab work & pertinent test results  History of Anesthesia Complications Negative for: history of anesthetic complications  Airway Mallampati: III  TM Distance: <3 FB Neck ROM: full    Dental  (+) Chipped   Pulmonary neg shortness of breath, sleep apnea    Pulmonary exam normal        Cardiovascular Exercise Tolerance: Good hypertension, (-) angina + CAD and + Past MI  Normal cardiovascular exam     Neuro/Psych negative neurological ROS  negative psych ROS   GI/Hepatic Neg liver ROS,GERD  Controlled,,  Endo/Other  diabetes, Type 2    Renal/GU negative Renal ROS  negative genitourinary   Musculoskeletal   Abdominal   Peds  Hematology negative hematology ROS (+)   Anesthesia Other Findings Past Medical History: No date: Actinic keratosis No date: CAD (coronary artery disease) No date: Diabetes mellitus without complication (HCC) No date: GERD (gastroesophageal reflux disease) No date: Hyperlipidemia No date: Hypertension No date: Myocardial infarction (HCC) No date: Pure hypercholesterolemia No date: Sleep apnea 12/02/2007: Squamous cell carcinoma of skin     Comment:  Right postauricular. Pigmented SCCis. No date: Vertigo  Past Surgical History: No date: CARDIAC CATHETERIZATION 11/04/2018: COLONOSCOPY WITH PROPOFOL ; N/A     Comment:  Procedure: COLONOSCOPY WITH PROPOFOL ;  Surgeon:               Gaylyn Gladis PENNER, MD;  Location: [facility] ENDOSCOPY;                Service: Endoscopy;  Laterality: N/A; No date: CORONARY ANGIOPLASTY WITH STENT PLACEMENT No date: TONSILLECTOMY     Reproductive/Obstetrics negative OB ROS                              Anesthesia Physical Anesthesia Plan  ASA: 3  Anesthesia Plan: General   Post-op  Pain Management:    Induction: Intravenous  PONV Risk Score and Plan: Propofol  infusion and TIVA  Airway Management Planned: Natural Airway and Nasal Cannula  Additional Equipment:   Intra-op Plan:   Post-operative Plan:   Informed Consent: I have reviewed the patients History and Physical, chart, labs and discussed the procedure including the risks, benefits and alternatives for the proposed anesthesia with the patient or authorized representative who has indicated his/her understanding and acceptance.     Dental Advisory Given  Plan Discussed with: Anesthesiologist, CRNA and Surgeon  Anesthesia Plan Comments: (Patient consented for risks of anesthesia including but not limited to:  - adverse reactions to medications - risk of airway placement if required - damage to eyes, teeth, lips or other oral mucosa - nerve damage due to positioning  - sore throat or hoarseness - Damage to heart, brain, nerves, lungs, other parts of body or loss of life  Patient voiced understanding and assent.)        Anesthesia Quick Evaluation

## 2024-02-08 NOTE — Anesthesia Postprocedure Evaluation (Signed)
 Anesthesia Post Note  Patient: Andre Schroeder  Procedure(s) Performed: COLONOSCOPY POLYPECTOMY, INTESTINE  Patient location during evaluation: Endoscopy Anesthesia Type: General Level of consciousness: awake and alert Pain management: pain level controlled Vital Signs Assessment: post-procedure vital signs reviewed and stable Respiratory status: spontaneous breathing, nonlabored ventilation and respiratory function stable Cardiovascular status: blood pressure returned to baseline and stable Postop Assessment: no apparent nausea or vomiting Anesthetic complications: no   There were no known notable events for this encounter.   Last Vitals:  Vitals:   02/08/24 1305 02/08/24 1308  BP: (!) 89/48   Pulse: 64 66  Resp: 15 (!) 21  Temp: (!) 35.8 C   SpO2: 93% 96%    Last Pain:  Vitals:   02/08/24 1308  TempSrc:   PainSc: 0-No pain                 Fairy POUR Vetta Couzens

## 2024-02-08 NOTE — Interval H&P Note (Signed)
 History and Physical Interval Note:  02/08/2024 12:36 PM  Andre Schroeder  has presented today for surgery, with the diagnosis of Hx of adenomatous colonic polyps (Z86.0101).  The various methods of treatment have been discussed with the patient and family. After consideration of risks, benefits and other options for treatment, the patient has consented to  Procedure(s): COLONOSCOPY (N/A) as a surgical intervention.  The patient's history has been reviewed, patient examined, no change in status, stable for surgery.  I have reviewed the patient's chart and labs.  Questions were answered to the patient's satisfaction.     [name]  Ok to proceed with colonoscopy

## 2024-02-08 NOTE — Op Note (Signed)
 [facility]astroenterology Patient Name: Andre Schroeder [facility] Procedure Date: 02/08/2024 12:41 PM MRN: [id number] Account #: [id number] Date of Birth: [birth year] Admit Type: Outpatient Age: 73 Room: [facility] [place] ENDO ROOM 3 Gender: Male Note Status: Finalized Instrument Name: Colon Scope [id number] Procedure:             Colonoscopy Indications:           Surveillance: Personal history of adenomatous polyps                         on last colonoscopy 5 years ago Providers:             Ole Schick MD, MD Medicines:             Monitored Anesthesia Care Complications:         No immediate complications. Estimated blood loss:                         Minimal. Procedure:             Pre-Anesthesia Assessment:                        - Prior to the procedure, a History and Physical was                         performed, and patient medications and allergies were                         reviewed. The patient is competent. The risks and                         benefits of the procedure and the sedation options and                         risks were discussed with the patient. All questions                         were answered and informed consent was obtained.                         Patient identification and proposed procedure were                         verified by the physician, the nurse, the                         anesthesiologist, the anesthetist and the technician                         in the endoscopy suite. Mental Status Examination:                         alert and oriented. Airway Examination: normal                         oropharyngeal airway and neck mobility. Respiratory                         Examination: clear to auscultation. CV Examination:  normal. Prophylactic Antibiotics: The patient does not                         require prophylactic antibiotics. Prior                         Anticoagulants: The patient has taken no  anticoagulant                         or antiplatelet agents. ASA Grade Assessment: III - A                         patient with severe systemic disease. After reviewing                         the risks and benefits, the patient was deemed in                         satisfactory condition to undergo the procedure. The                         anesthesia plan was to use monitored anesthesia care                         (MAC). Immediately prior to administration of                         medications, the patient was re-assessed for adequacy                         to receive sedatives. The heart rate, respiratory                         rate, oxygen saturations, blood pressure, adequacy of                         pulmonary ventilation, and response to care were                         monitored throughout the procedure. The physical                         status of the patient was re-assessed after the                         procedure.                        After obtaining informed consent, the colonoscope was                         passed under direct vision. Throughout the procedure,                         the patient's blood pressure, pulse, and oxygen                         saturations were monitored continuously. The  Colonoscope was introduced through the anus and                         advanced to the the terminal ileum, with                         identification of the appendiceal orifice and IC                         valve. The colonoscopy was performed without                         difficulty. The patient tolerated the procedure well.                         The quality of the bowel preparation was good. The                         terminal ileum, ileocecal valve, appendiceal orifice,                         and rectum were photographed. Findings:      The perianal and digital rectal examinations were normal.      The terminal ileum appeared  normal.      Two sessile polyps were found in the transverse colon. The polyps were 2       to 4 mm in size. These polyps were removed with a cold snare. Resection       and retrieval were complete. Estimated blood loss was minimal.      A few small-mouthed diverticula were found in the sigmoid colon.      Internal hemorrhoids were found during retroflexion. The hemorrhoids       were Grade I (internal hemorrhoids that do not prolapse).      The exam was otherwise without abnormality on direct and retroflexion       views. Impression:            - The examined portion of the ileum was normal.                        - Two 2 to 4 mm polyps in the transverse colon,                         removed with a cold snare. Resected and retrieved.                        - Diverticulosis in the sigmoid colon.                        - Internal hemorrhoids.                        - The examination was otherwise normal on direct and                         retroflexion views. Recommendation:        - Discharge patient to home.                        -  Resume previous diet.                        - Continue present medications.                        - Await pathology results.                        - Repeat colonoscopy is not recommended due to current                         age (14 years or older) for surveillance.                        - Return to referring physician as previously                         scheduled. Procedure Code(s):     --- Professional ---                        [phone], Colonoscopy, flexible; with removal of                         tumor(s), polyp(s), or other lesion(s) by snare                         technique Diagnosis Code(s):     --- Professional ---                        Z86.010, Personal history of colonic polyps                        K64.0, First degree hemorrhoids                        D12.3, Benign neoplasm of transverse colon (hepatic                          flexure or splenic flexure)                        K57.30, Diverticulosis of large intestine without                         perforation or abscess without bleeding CPT copyright 2022 [employer]. All rights reserved. The codes documented in this report are preliminary and upon coder review may  be revised to meet current compliance requirements. Ole Schick MD, MD 02/08/2024 1:13:02 PM Number of Addenda: 0 Note Initiated On: 02/08/2024 12:41 PM Scope Withdrawal Time: 0 hours 10 minutes 3 seconds  Total Procedure Duration: 0 hours 12 minutes 56 seconds  Estimated Blood Loss:  Estimated blood loss was minimal.      [facility]

## 2024-02-08 NOTE — Transfer of Care (Signed)
 Immediate Anesthesia Transfer of Care Note  Patient: Andre Schroeder  Procedure(s) Performed: COLONOSCOPY POLYPECTOMY, INTESTINE  Patient Location: PACU  Anesthesia Type:General  Level of Consciousness: drowsy and patient cooperative  Airway & Oxygen Therapy: Patient Spontanous Breathing and Patient connected to nasal cannula oxygen  Post-op Assessment: Report given to RN and Post -op Vital signs reviewed and stable  Post vital signs: stable  Last Vitals:  Vitals Value Taken Time  BP 89/48 02/08/24 13:05  Temp 35.8 C 02/08/24 13:05  Pulse 64 02/08/24 13:05  Resp 15 02/08/24 13:05  SpO2 93 % 02/08/24 13:05    Last Pain:  Vitals:   02/08/24 1305  TempSrc: Temporal  PainSc: Asleep         Complications: There were no known notable events for this encounter.

## 2024-02-10 DIAGNOSIS — E119 Type 2 diabetes mellitus without complications: Secondary | ICD-10-CM | POA: Diagnosis not present

## 2024-02-10 DIAGNOSIS — G4733 Obstructive sleep apnea (adult) (pediatric): Secondary | ICD-10-CM | POA: Diagnosis not present

## 2024-02-10 DIAGNOSIS — I2510 Atherosclerotic heart disease of native coronary artery without angina pectoris: Secondary | ICD-10-CM | POA: Diagnosis not present

## 2024-02-10 DIAGNOSIS — I10 Essential (primary) hypertension: Secondary | ICD-10-CM | POA: Diagnosis not present

## 2024-02-10 DIAGNOSIS — E7800 Pure hypercholesterolemia, unspecified: Secondary | ICD-10-CM | POA: Diagnosis not present

## 2024-02-10 LAB — SURGICAL PATHOLOGY

## 2024-02-11 DIAGNOSIS — J0100 Acute maxillary sinusitis, unspecified: Secondary | ICD-10-CM | POA: Diagnosis not present
